# Patient Record
Sex: Female | Born: 1990 | Race: White | Hispanic: No | Marital: Single | Smoking: Never smoker
Health system: Southern US, Community
[De-identification: ages and names within clinical notes are randomized; demographics above are authoritative.]

## PROBLEM LIST (undated history)

## (undated) DIAGNOSIS — F988 Other specified behavioral and emotional disorders with onset usually occurring in childhood and adolescence: Secondary | ICD-10-CM

## (undated) DIAGNOSIS — T7840XA Allergy, unspecified, initial encounter: Secondary | ICD-10-CM

## (undated) DIAGNOSIS — F32A Depression, unspecified: Secondary | ICD-10-CM

## (undated) DIAGNOSIS — F329 Major depressive disorder, single episode, unspecified: Secondary | ICD-10-CM

## (undated) DIAGNOSIS — K219 Gastro-esophageal reflux disease without esophagitis: Secondary | ICD-10-CM

## (undated) DIAGNOSIS — I10 Essential (primary) hypertension: Secondary | ICD-10-CM

## (undated) HISTORY — DX: Essential (primary) hypertension: I10

## (undated) HISTORY — DX: Gastro-esophageal reflux disease without esophagitis: K21.9

## (undated) HISTORY — DX: Major depressive disorder, single episode, unspecified: F32.9

## (undated) HISTORY — DX: Other specified behavioral and emotional disorders with onset usually occurring in childhood and adolescence: F98.8

## (undated) HISTORY — DX: Depression, unspecified: F32.A

## (undated) HISTORY — PX: NO PAST SURGERIES: SHX2092

## (undated) HISTORY — DX: Allergy, unspecified, initial encounter: T78.40XA

---

## 2012-12-22 ENCOUNTER — Ambulatory Visit: Payer: Self-pay | Admitting: Unknown Physician Specialty

## 2012-12-22 ENCOUNTER — Emergency Department: Payer: Self-pay | Admitting: Emergency Medicine
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
## 2015-07-24 ENCOUNTER — Ambulatory Visit (INDEPENDENT_AMBULATORY_CARE_PROVIDER_SITE_OTHER): Payer: BLUE CROSS/BLUE SHIELD | Admitting: Obstetrics and Gynecology

## 2015-07-24 ENCOUNTER — Encounter: Payer: Self-pay | Admitting: Obstetrics and Gynecology

## 2015-07-24 VITALS — BP 125/84 | HR 94 | Ht 65.0 in | Wt 154.8 lb

## 2015-07-24 DIAGNOSIS — F419 Anxiety disorder, unspecified: Secondary | ICD-10-CM | POA: Diagnosis not present

## 2015-07-24 DIAGNOSIS — Z01419 Encounter for gynecological examination (general) (routine) without abnormal findings: Secondary | ICD-10-CM | POA: Diagnosis not present

## 2015-07-24 DIAGNOSIS — I10 Essential (primary) hypertension: Secondary | ICD-10-CM | POA: Diagnosis not present

## 2015-07-24 NOTE — Patient Instructions (Signed)
1.  Pap smear with  Chlamydia testing is performed today. 2.  Return in 2 weeks for Mirena IUD 3.  Abstain from intercourse for 2 weeks prior to Mirena IUD insertion. 4.  Take 800 mg ibuprofen (Advil) before coming in for IUD insertion. 5.  Annual exam-1 year

## 2015-07-24 NOTE — Progress Notes (Signed)
Patient ID: Stacy [name], female   DOB: Mar 14, 1991, 25 y.o.   MRN: [id number]  ANNUAL PREVENTATIVE CARE GYN  ENCOUNTER NOTE  Subjective:       Stacy [name] is a 25 y.o. G0P0000 female here for a routine annual gynecologic exam.  Current complaints: 1.  Wants to discuss iud  2.  History of hypertension, controlled on medication   Gynecologic History Patient's last menstrual period was 07/03/2015 (approximate). Contraception: condoms Last Pap: 2013. Results were: normal Last mammogram: n/a. Results were: n/a  Obstetric History OB History  Gravida Para Term Preterm AB SAB TAB Ectopic Multiple Living  0 0 0 0 0 0 0 0 0 0         Past Medical History  Diagnosis Date  . ADD (attention deficit disorder)   . Hypertension   . Allergy     Past Surgical History  Procedure Laterality Date  . No past surgeries      Current Outpatient Prescriptions on File Prior to Visit  Medication Sig Dispense Refill  . atomoxetine (STRATTERA) 100 MG capsule Take 100 mg by mouth daily.     No current facility-administered medications on file prior to visit.    No Known Allergies  Social History   Social History  . Marital Status: Single    Spouse Name: N/A  . Number of Children: N/A  . Years of Education: N/A   Occupational History  . Not on file.   Social History Main Topics  . Smoking status: Never Smoker   . Smokeless tobacco: Not on file  . Alcohol Use: Yes     Comment: occas  . Drug Use: No  . Sexual Activity: Yes    Birth Control/ Protection: Condom   Other Topics Concern  . Not on file   Social History Narrative    Family History  Problem Relation Age of Onset  . Heart disease Mother   . Heart disease Father   . Cancer Neg Hx   . Diabetes Neg Hx     The following portions of the patient's history were reviewed and updated as appropriate: allergies, current medications, past family history, past medical history, past social history, past surgical  history and problem list.  Review of Systems ROS Review of Systems - General ROS: negative for - chills, fatigue, fever, hot flashes, night sweats, weight gain or weight loss Psychological ROS: negative for - anxiety, decreased libido, depression, mood swings, physical abuse or sexual abuse Ophthalmic ROS: negative for - blurry vision, eye pain or loss of vision ENT ROS: negative for - headaches, hearing change, visual changes or vocal changes Allergy and Immunology ROS: negative for - hives, itchy/watery eyes or seasonal allergies Hematological and Lymphatic ROS: negative for - bleeding problems, bruising, swollen lymph nodes or weight loss Endocrine ROS: negative for - galactorrhea, hair pattern changes, hot flashes, malaise/lethargy, mood swings, palpitations, polydipsia/polyuria, skin changes, temperature intolerance or unexpected weight changes Breast ROS: negative for - new or changing breast lumps or nipple discharge Respiratory ROS: negative for - cough or shortness of breath Cardiovascular ROS: negative for - chest pain, irregular heartbeat, palpitations or shortness of breath Gastrointestinal ROS: no abdominal pain, change in bowel habits, or black or bloody stools Genito-Urinary ROS: no dysuria, trouble voiding, or hematuria Musculoskeletal ROS: negative for - joint pain or joint stiffness Neurological ROS: negative for - bowel and bladder control changes Dermatological ROS: negative for rash and skin lesion changes   Objective:   BP 125/84 mmHg  Pulse 94  Ht 5\' 5"  (1.651 m)  Wt 154 lb 12.8 oz (70.217 kg)  BMI 25.76 kg/m2  LMP 07/03/2015 (Approximate) CONSTITUTIONAL: Well-developed, well-nourished female in no acute distress.  PSYCHIATRIC: Normal mood and affect. Normal behavior. Normal judgment and thought content. NEUROLGIC: Alert and oriented to person, place, and time. Normal muscle tone coordination. No cranial nerve deficit noted. HENT:  Normocephalic, atraumatic,  External right and left ear normal. Oropharynx is clear and moist EYES: Conjunctivae and EOM are normal. Pupils are equal, round, and reactive to light. No scleral icterus.  NECK: Normal range of motion, supple, no masses.  Normal thyroid.  SKIN: Skin is warm and dry. No rash noted. Not diaphoretic. No erythema. No pallor. CARDIOVASCULAR: Normal heart rate noted, regular rhythm, no murmur. RESPIRATORY: Clear to auscultation bilaterally. Effort and breath sounds normal, no problems with respiration noted. BREASTS: Symmetric in size. No masses, skin changes, nipple drainage, or lymphadenopathy. ABDOMEN: Soft, normal bowel sounds, no distention noted.  No tenderness, rebound or guarding.  BLADDER: Normal PELVIC:  External Genitalia: Normal  BUS: Normal  Vagina: Normal  Cervix: Normal  Uterus: Normal; Anteverted, mobile, normal size and shape, nontender  Adnexa: Normal  RV: External Exam NormaI  MUSCULOSKELETAL: Normal range of motion. No tenderness.  No cyanosis, clubbing, or edema.  2+ distal pulses. LYMPHATIC: No Axillary, Supraclavicular, or Inguinal Adenopathy.    Assessment:   Annual gynecologic examination 25 y.o. Contraception: condoms; Desires IUD bmi-25 Chronic hypertension, stable on medication  Plan:  Pap: Pap, Reflex if ASCUS Mammogram: Not Indicated Stool Guaiac Testing:  Not Indicated Labs: thru pcp- Dr Arlana Pouchtate Routine preventative health maintenance measures emphasized: Exercise/Diet/Weight control, Tobacco Warnings, Alcohol/Substance use risks, Stress Management and Safe Sex Return for Mirena IUD insertion;; abstain from intercourse for 2 weeks prior to insertion; take 800 mg of ibuprofen prior to visit Return to Clinic - 1 Year   SunGardCrystal Miller, CMA  Herold HarmsMartin A Shanyce Daris, MD  Note: This dictation was prepared with Dragon dictation along with smaller phrase technology. Any transcriptional errors that result from this process are unintentional.

## 2015-07-29 LAB — PAP IG, CT-NG, RFX HPV ASCU: PAP Smear Comment: 0

## 2015-08-07 ENCOUNTER — Encounter: Payer: Self-pay | Admitting: Obstetrics and Gynecology

## 2015-08-07 ENCOUNTER — Ambulatory Visit (INDEPENDENT_AMBULATORY_CARE_PROVIDER_SITE_OTHER): Payer: BLUE CROSS/BLUE SHIELD | Admitting: Obstetrics and Gynecology

## 2015-08-07 VITALS — BP 114/74 | HR 92 | Ht 65.0 in | Wt 156.4 lb

## 2015-08-07 DIAGNOSIS — Z975 Presence of (intrauterine) contraceptive device: Secondary | ICD-10-CM

## 2015-08-07 LAB — POCT URINE PREGNANCY: PREG TEST UR: NEGATIVE

## 2015-08-07 NOTE — Patient Instructions (Signed)
1.  Return in 4 weeks for IUD string check. 2.  Advil 600-800 mg 3 times a day for cramps

## 2015-08-07 NOTE — Progress Notes (Signed)
IUD Insertion Procedure Note  Pre-operative Diagnosis: Contraception  Post-operative Diagnosis: Same  Indications: contraception  Procedure Details  Urine pregnancy test was done  and result was Negative.  The risks (including infection, bleeding, pain, and uterine perforation) and benefits of the procedure were explained to the patient and Verbal informed consent was obtained.    Cervix cleansed with Betadine. Uterus sounded to 7 cm. IUD inserted without difficulty. String visible and trimmed-3.5 cm. Patient tolerated procedure well.  IUD Information: Mirena, Lot # [id number], Expiration date 5/19.  Condition: Stable  Complications: None  Plan:  The patient was advised to call for any fever or for prolonged or severe pain or bleeding. She was advised to use OTC ibuprofen as needed for mild to moderate pain.   Attending Physician Documentation: Herold Harms, MD  Note: This dictation was prepared with Dragon dictation along with smaller phrase technology. Any transcriptional errors that result from this process are unintentional.

## 2015-08-19 ENCOUNTER — Telehealth: Payer: Self-pay | Admitting: Obstetrics and Gynecology

## 2015-08-19 NOTE — Telephone Encounter (Signed)
PT CALLED AND SHE HAD AN IUD PLACED ON THE 19TH AND SHE IS HAVING SOME ISSUE AND WANTED TO KNOW IF ITS NORMAL, SHE STARTED HER PERIOD 4 DAYS AFTER IT BEING PLACED AND IT WAS VERY PAINFUL, AND SHE HAS BEEN BLEEDING FOR OVER A WEEK, AND EVERY DAY SHE IS HAVING PAIN, AND SHE NEVER HAD THESE ISSUE BEFORE HAVING THE IUD PLACED.

## 2015-08-19 NOTE — Telephone Encounter (Signed)
Pt states she has had bleeding on/off for 7 days. She has an uncomfortable feeling when she is bleeding. Takes aleve. Advised to take ibup  q 8h. Advised it is normal to have irregular bleeding for about 6 months on mirena. Encouraged to keep 4 week f/u. If she needs to she may call and move appt up.

## 2015-08-26 ENCOUNTER — Ambulatory Visit (INDEPENDENT_AMBULATORY_CARE_PROVIDER_SITE_OTHER): Payer: BLUE CROSS/BLUE SHIELD | Admitting: Obstetrics and Gynecology

## 2015-08-26 ENCOUNTER — Other Ambulatory Visit: Payer: Self-pay | Admitting: Obstetrics and Gynecology

## 2015-08-26 ENCOUNTER — Encounter: Payer: Self-pay | Admitting: Obstetrics and Gynecology

## 2015-08-26 ENCOUNTER — Ambulatory Visit (INDEPENDENT_AMBULATORY_CARE_PROVIDER_SITE_OTHER): Payer: BLUE CROSS/BLUE SHIELD

## 2015-08-26 VITALS — BP 116/80 | HR 82 | Ht 65.0 in | Wt 154.9 lb

## 2015-08-26 DIAGNOSIS — N921 Excessive and frequent menstruation with irregular cycle: Secondary | ICD-10-CM

## 2015-08-26 DIAGNOSIS — N920 Excessive and frequent menstruation with regular cycle: Secondary | ICD-10-CM

## 2015-08-26 DIAGNOSIS — Z975 Presence of (intrauterine) contraceptive device: Principal | ICD-10-CM

## 2015-08-26 DIAGNOSIS — R102 Pelvic and perineal pain: Secondary | ICD-10-CM

## 2015-08-26 NOTE — Progress Notes (Signed)
Chief complaint: 1.  Abnormal uterine bleeding, status post IUD insertion. 2.  Pelvic pain  Patient is now 3 weeks status post Mirena IUD insertion.  Since insertion she has been having daily bleeding, occasionally at times consistent with heavy bleeding and clots, with Alidase as spreading spotting only.  Patient has not felt well since the IUD insertion with pelvic discomfort.  The pelvic discomfort is difficult to characterize.  She has had intercourse since insertion and did not experience any pain with intercourse. Patient denies anemia symptoms at this time.  She just does not feel well.  Past medical history, Past surgical history, problems, medications, and allergies are reviewed.  Review of systems: Per HPI.  OBJECTIVE: BP 116/80 mmHg  Pulse 82  Ht  (1.651 m)  Wt 154 lb 14.4 oz (70.262 kg)  BMI 25.78 kg/m2  LMP  (LMP Unknown) Pleasant white female, slightly anxious, in no acute distress. Back: Without CVA tenderness. Abdomen: Soft, nontender, without organomegaly; no peritoneal signs.  Pelvic exam: External genitalia-normal BUS-normal Vagina-animal dark blood in vault without bright red bleeding. Cervix-IUD string 3 cm; no lesions; no cervical motion tenderness Uterus-midplane, mobile, normal size, shape, NON-TENDER; IUD is not palpable in lower uterine segment or at os Adnexa-nonpalpable and nontender. Rectovaginal-normal.  External exam  ASSESSMENT: 1.  3 weeks status post Mirena IUD insertion. 2.  Chronic abnormal uterine bleeding. 3.  Pelvic pain and dysphoria since IUD insertion. 4.  Clinically appears to be appropriately placed.  On exam.  PLAN: 1.  CBC. 2.  Pelvic ultrasound to confirm IUD location. 3.  Monitor symptoms and return in 4 weeks for follow-up  A total of 15 minutes were spent face-to-face with the patient during this encounter and over half of that time dealt with counseling and coordination of care.  Herold Harms, MD  Note: This  dictation was prepared with Dragon dictation along with smaller phrase technology. Any transcriptional errors that result from this process are unintentional.

## 2015-08-26 NOTE — Patient Instructions (Signed)
1.  CBC today. 2.  Ultrasound to assess for IUD location. 3.  Monitor.  Abnormal uterine bleeding and pelvic pain symptoms and follow up in 4 weeks

## 2015-08-27 LAB — CBC WITH DIFFERENTIAL/PLATELET
BASOS ABS: 0 10*3/uL (ref 0.0–0.2)
Basos: 1 %
EOS (ABSOLUTE): 0.2 10*3/uL (ref 0.0–0.4)
Eos: 3 %
Hematocrit: 37.2 % (ref 34.0–46.6)
Hemoglobin: 12.2 g/dL (ref 11.1–15.9)
IMMATURE GRANS (ABS): 0 10*3/uL (ref 0.0–0.1)
Immature Granulocytes: 0 %
LYMPHS: 24 %
Lymphocytes Absolute: 1.5 10*3/uL (ref 0.7–3.1)
MCH: 28.2 pg (ref 26.6–33.0)
MCHC: 32.8 g/dL (ref 31.5–35.7)
MCV: 86 fL (ref 79–97)
MONOS ABS: 0.4 10*3/uL (ref 0.1–0.9)
Monocytes: 6 %
NEUTROS ABS: 4.1 10*3/uL (ref 1.4–7.0)
Neutrophils: 66 %
PLATELETS: 231 10*3/uL (ref 150–379)
RBC: 4.33 x10E6/uL (ref 3.77–5.28)
RDW: 12.8 % (ref 12.3–15.4)
WBC: 6.2 10*3/uL (ref 3.4–10.8)

## 2015-09-04 ENCOUNTER — Ambulatory Visit: Payer: BLUE CROSS/BLUE SHIELD | Admitting: Obstetrics and Gynecology

## 2015-09-12 ENCOUNTER — Telehealth: Payer: Self-pay | Admitting: Obstetrics and Gynecology

## 2015-09-12 NOTE — Telephone Encounter (Signed)
Pt is 6 week s/p mirena insertion. See 2 weeks ago for aub and pelvic pain. Exam wnl. U/s for iud placement normal. Pt states her pain and bleeding is better. Now she has no desire to have IC. IC is not painful. Advised this may work it self out after 3 months of having mirena in. Has an appt with mad on 09/23/15. Will discus with him.

## 2015-09-12 NOTE — Telephone Encounter (Signed)
Stacy [name] has a question about her IUD. She wouldn't tell me what it was. sorry

## 2015-09-23 ENCOUNTER — Ambulatory Visit (INDEPENDENT_AMBULATORY_CARE_PROVIDER_SITE_OTHER): Payer: BLUE CROSS/BLUE SHIELD | Admitting: Obstetrics and Gynecology

## 2015-09-23 ENCOUNTER — Encounter: Payer: Self-pay | Admitting: Obstetrics and Gynecology

## 2015-09-23 VITALS — BP 126/84 | HR 76 | Ht 65.0 in | Wt 154.4 lb

## 2015-09-23 DIAGNOSIS — Z975 Presence of (intrauterine) contraceptive device: Secondary | ICD-10-CM

## 2015-09-23 DIAGNOSIS — R6882 Decreased libido: Secondary | ICD-10-CM | POA: Insufficient documentation

## 2015-09-23 NOTE — Progress Notes (Signed)
Chief complaint: 1. IUD string check 2. Decreased libido  Patient presents for above issues. Ever since her IUD insertion is ago, she has noted a dramatic decrease in sexual function. Sex drive, lubrication ability, orgasmic ability, and satisfaction with intercourse has diminished dramatically. She does not report any other new health issues. No chronic alcohol, drug, tobacco use. No new partner. No new diagnoses with new medications. No chronic medications. Abnormal uterine bleeding resolved several weeks ago. Patient is not experiencing any pelvic pain.  Past medical history, past surgical history, problem list, medications, and allergies are reviewed  OBJECTIVE: BP 126/84 mmHg  Pulse 76  Ht 5\' 5"  (1.651 m)  Wt 154 lb 6.4 oz (70.035 kg)  BMI 25.69 kg/m2  LMP  (LMP Unknown) Pleasant tearful female in no acute distress. Slightly anxious. Abdomen: Soft, nontender Pelvic exam: Extremities normal BUS-normal Vagina-normal Cervix-IUD string 3 cm; no cervical motion tenderness Uterus-midplane, normal size and shape, nontender Adnexa-nonpalpable nontender Rectovaginal exam-normal external exam  ASSESSMENT: 1. Normal IUD string check 2. Decreased libido, possibly related to progestin contraceptive  PLAN: 1. Monitor symptomatology over the next 6 weeks 2. Return in 6 weeks for recheck 3. Possible options of management have been reviewed.  A total of 15 minutes were spent face-to-face with the patient during this encounter and over half of that time dealt with counseling and coordination of care.  Herold HarmsMartin A Samarion Ehle, MD  Note: This dictation was prepared with Dragon dictation along with smaller phrase technology. Any transcriptional errors that result from this process are unintentional.

## 2015-09-23 NOTE — Patient Instructions (Signed)
1. Return in 6 weeks for follow-up on IUD and libido

## 2015-10-02 ENCOUNTER — Encounter: Payer: Self-pay | Admitting: Obstetrics and Gynecology

## 2015-10-02 ENCOUNTER — Ambulatory Visit: Payer: BLUE CROSS/BLUE SHIELD | Admitting: Obstetrics and Gynecology

## 2015-10-02 ENCOUNTER — Ambulatory Visit (INDEPENDENT_AMBULATORY_CARE_PROVIDER_SITE_OTHER): Payer: BLUE CROSS/BLUE SHIELD | Admitting: Obstetrics and Gynecology

## 2015-10-02 VITALS — BP 123/86 | HR 96 | Ht 65.0 in | Wt 154.6 lb

## 2015-10-02 DIAGNOSIS — N921 Excessive and frequent menstruation with irregular cycle: Secondary | ICD-10-CM

## 2015-10-02 DIAGNOSIS — R102 Pelvic and perineal pain: Secondary | ICD-10-CM

## 2015-10-02 DIAGNOSIS — R6882 Decreased libido: Secondary | ICD-10-CM | POA: Diagnosis not present

## 2015-10-02 DIAGNOSIS — Z975 Presence of (intrauterine) contraceptive device: Secondary | ICD-10-CM | POA: Diagnosis not present

## 2015-10-02 DIAGNOSIS — L659 Nonscarring hair loss, unspecified: Secondary | ICD-10-CM

## 2015-10-02 NOTE — Progress Notes (Signed)
ROS  GYN ENCOUNTER NOTE  Subjective:       Stacy HandlerCaroline A [name] is a 25 y.o. G0P0000 female is here for gynecologic evaluation of the following issues:  1. IUD Removal   25 y/o F status post IUD insertion on 08/07/2015. Since insertion patient has been experiencing many problems. Endorses hair loss x 1-1.5 weeks. Endorses a dramatic decrease in sexual function including libido and orgasmic ability. Denies alcohol, tobacco, or drug use. Denies pain. Pt wishes to IUD removed. Contraception options reviewed, plans to use condoms for contraception upon removal.    Gynecologic History No LMP recorded. Patient is not currently having periods (Reason: IUD).   Obstetric History OB History  Gravida Para Term Preterm AB SAB TAB Ectopic Multiple Living  0 0 0 0 0 0 0 0 0 0         Past Medical History  Diagnosis Date  . ADD (attention deficit disorder)   . Hypertension   . Allergy     Past Surgical History  Procedure Laterality Date  . No past surgeries      Current Outpatient Prescriptions on File Prior to Visit  Medication Sig Dispense Refill  . atomoxetine (STRATTERA) 100 MG capsule Take 100 mg by mouth daily.    . carvedilol (COREG) 12.5 MG tablet Take 12.5 mg by mouth 2 (two) times daily with a meal.    . cetirizine (ZYRTEC) 10 MG tablet Take 10 mg by mouth daily.    Marland Kitchen. levonorgestrel (MIRENA) 20 MCG/24HR IUD 1 each by Intrauterine route once.     No current facility-administered medications on file prior to visit.    No Known Allergies  Social History   Social History  . Marital Status: Single    Spouse Name: N/A  . Number of Children: N/A  . Years of Education: N/A   Occupational History  . Not on file.   Social History Main Topics  . Smoking status: Never Smoker   . Smokeless tobacco: Not on file  . Alcohol Use: Yes     Comment: occas  . Drug Use: No  . Sexual Activity: Yes    Birth Control/ Protection: Condom, IUD   Other Topics Concern  . Not on file    Social History Narrative    Family History  Problem Relation Age of Onset  . Heart disease Mother   . Heart disease Father   . Cancer Neg Hx   . Diabetes Neg Hx     The following portions of the patient's history were reviewed and updated as appropriate: allergies, current medications, past family history, past medical history, past social history, past surgical history and problem list.  Review of Systems Review of Systems - Psychological ROS: positive for - decreased libido Endocrine ROS: positive for - hair pattern changes Review of Systems - General ROS: negative for - chills, fatigue, fever, hot flashes, malaise or night sweats Hematological and Lymphatic ROS: negative for - bleeding problems or swollen lymph nodes Gastrointestinal ROS: negative for - abdominal pain, blood in stools, change in bowel habits and nausea/vomiting Musculoskeletal ROS: negative for - joint pain, muscle pain or muscular weakness Genito-Urinary ROS: negative for - change in menstrual cycle, dysmenorrhea, dyspareunia, dysuria, genital discharge, genital ulcers, hematuria, incontinence, irregular/heavy menses, nocturia or pelvic pain  Objective:   BP 123/86 mmHg  Pulse 96  Ht 5\' 5"  (1.651 m)  Wt 154 lb 9.6 oz (70.126 kg)  BMI 25.73 kg/m2 CONSTITUTIONAL: Well-developed, well-nourished female in no acute distress.  HENT:  Normocephalic, atraumatic.  NEUROLGIC: Alert and oriented to person, place, and time. PSYCHIATRIC: Normal mood and affect. Normal behavior. Normal judgment and thought content. CARDIOVASCULAR:Not Examined RESPIRATORY: Not Examined BREASTS: Not Examined ABDOMEN: Not Examined PELVIC:   External Genitalia: Normal  BUS: Normal  Vagina: Normal  Cervix: Normal; IUD string 2 cm  Uterus: Normal size, shape,consistency, mobile  Adnexa: Normal  RV: Normal   Bladder: Nontender  PROCEDURE: IUD removal IUD string is grasped with [place] forceps and the IUD is removed without  difficulty; no bleeding encountered. Procedure well-tolerated.   Assessment:   1. IUD Removal  2. Decreased Libido  3. Hair Loss   Plan:  1. IUD Removed 2. Contraception options reviewed; pt will use Condoms 3. Take Advil or Aleeve for cramping  Return to Clinic PRN  A total of 15 minutes were spent face-to-face with the patient during this encounter and over half of that time dealt with counseling and coordination of care.  Avie Arenas, Student-PA  Herold Harms, MD   I have seen, interviewed, and examined the patient in conjunction with the [facility].A. student and affirm the diagnosis and management plan. Mireya Meditz A. Joon Pohle, MD, FACOG   Note: This dictation was prepared with Dragon dictation along with smaller phrase technology. Any transcriptional errors that result from this process are unintentional.  incomplete

## 2015-10-02 NOTE — Patient Instructions (Signed)
1. IUD removed 2. Motrin or Aleve as needed for cramps 3. Condoms for contraception. 4. Return as needed if other contraceptive choice is desired.

## 2015-11-04 ENCOUNTER — Ambulatory Visit: Payer: BLUE CROSS/BLUE SHIELD | Admitting: Obstetrics and Gynecology

## 2016-12-14 ENCOUNTER — Other Ambulatory Visit: Payer: Self-pay | Admitting: Student

## 2016-12-14 DIAGNOSIS — K219 Gastro-esophageal reflux disease without esophagitis: Secondary | ICD-10-CM

## 2016-12-20 ENCOUNTER — Ambulatory Visit
Admission: RE | Admit: 2016-12-20 | Discharge: 2016-12-20 | Disposition: A | Discharge status: Home / Self Care | Payer: BLUE CROSS/BLUE SHIELD | Source: Ambulatory Visit | Attending: Student | Admitting: Student

## 2016-12-20 DIAGNOSIS — K219 Gastro-esophageal reflux disease without esophagitis: Secondary | ICD-10-CM | POA: Insufficient documentation

## 2017-06-28 ENCOUNTER — Encounter: Payer: BLUE CROSS/BLUE SHIELD | Admitting: Obstetrics and Gynecology

## 2017-06-28 NOTE — Progress Notes (Deleted)
Patient ID: Stacy [name], female   DOB: [birth year], 26 y.o.   MRN: [id number]  ANNUAL PREVENTATIVE CARE GYN  ENCOUNTER NOTE  Subjective:       Stacy [name] is a 26 y.o. G0P0000 female here for a routine annual gynecologic exam.  Current complaints:  1.  History of hypertension, controlled on medication   Gynecologic History No LMP recorded. Patient is not currently having periods (Reason: IUD). Contraception: IUD Pap 07/2015 neg Results were: normal Last mammogram: n/a. Results were: n/a  Obstetric History OB History  Gravida Para Term Preterm AB Living  0 0 0 0 0 0  SAB TAB Ectopic Multiple Live Births  0 0 0 0          Past Medical History:  Diagnosis Date  . ADD (attention deficit disorder)   . Allergy   . Hypertension     Past Surgical History:  Procedure Laterality Date  . NO PAST SURGERIES      Current Outpatient Medications on File Prior to Visit  Medication Sig Dispense Refill  . atomoxetine (STRATTERA) 100 MG capsule Take 100 mg by mouth daily.    . carvedilol (COREG) 12.5 MG tablet Take 12.5 mg by mouth 2 (two) times daily with a meal.    . cetirizine (ZYRTEC) 10 MG tablet Take 10 mg by mouth daily.    Marland Kitchen. levonorgestrel (MIRENA) 20 MCG/24HR IUD 1 each by Intrauterine route once.     No current facility-administered medications on file prior to visit.     No Known Allergies  Social History   Socioeconomic History  . Marital status: Single    Spouse name: Not on file  . Number of children: Not on file  . Years of education: Not on file  . Highest education level: Not on file  Social Needs  . Financial resource strain: Not on file  . Food insecurity - worry: Not on file  . Food insecurity - inability: Not on file  . Transportation needs - medical: Not on file  . Transportation needs - non-medical: Not on file  Occupational History  . Not on file  Tobacco Use  . Smoking status: Never Smoker  Substance and Sexual Activity  . Alcohol  use: Yes    Comment: occas  . Drug use: No  . Sexual activity: Yes    Birth control/protection: Condom, IUD  Other Topics Concern  . Not on file  Social History Narrative  . Not on file    Family History  Problem Relation Age of Onset  . Heart disease Mother   . Heart disease Father   . Cancer Neg Hx   . Diabetes Neg Hx     The following portions of the patient's history were reviewed and updated as appropriate: allergies, current medications, past family history, past medical history, past social history, past surgical history and problem list.  Review of Systems ROS   Objective:   There were no vitals taken for this visit. CONSTITUTIONAL: Well-developed, well-nourished female in no acute distress.  PSYCHIATRIC: Normal mood and affect. Normal behavior. Normal judgment and thought content. NEUROLGIC: Alert and oriented to person, place, and time. Normal muscle tone coordination. No cranial nerve deficit noted. HENT:  Normocephalic, atraumatic, External right and left ear normal. Oropharynx is clear and moist EYES: Conjunctivae and EOM are normal. Pupils are equal, round, and reactive to light. No scleral icterus.  NECK: Normal range of motion, supple, no masses.  Normal thyroid.  SKIN: Skin is  warm and dry. No rash noted. Not diaphoretic. No erythema. No pallor. CARDIOVASCULAR: Normal heart rate noted, regular rhythm, no murmur. RESPIRATORY: Clear to auscultation bilaterally. Effort and breath sounds normal, no problems with respiration noted. BREASTS: Symmetric in size. No masses, skin changes, nipple drainage, or lymphadenopathy. ABDOMEN: Soft, normal bowel sounds, no distention noted.  No tenderness, rebound or guarding.  BLADDER: Normal PELVIC:  External Genitalia: Normal  BUS: Normal  Vagina: Normal  Cervix: Normal  Uterus: Normal; Anteverted, mobile, normal size and shape, nontender  Adnexa: Normal  RV: External Exam NormaI  MUSCULOSKELETAL: Normal range of  motion. No tenderness.  No cyanosis, clubbing, or edema.  2+ distal pulses. LYMPHATIC: No Axillary, Supraclavicular, or Inguinal Adenopathy.    Assessment:   Annual gynecologic examination 26 y.o. Contraception- iud bmi-25 Chronic hypertension, stable on medication  Plan:  Pap: Pap, Reflex if ASCUS Mammogram: Not Indicated Stool Guaiac Testing:  Not Indicated Labs: thru pcp- Dr Arlana Pouchtate Routine preventative health maintenance measures emphasized: Exercise/Diet/Weight control, Tobacco Warnings, Alcohol/Substance use risks, Stress Management and Safe Sex Return to Clinic - 1 Year   Waleska Buttery ShirleyMiller, [place]   Note: This dictation was prepared with Dragon dictation along with smaller phrase technology. Any transcriptional errors that result from this process are unintentional.

## 2017-07-25 NOTE — Progress Notes (Deleted)
Patient ID: Stacy [name], female   DOB: July 08, 1991, 27 y.o.   MRN: [id number]  ANNUAL PREVENTATIVE CARE GYN  ENCOUNTER NOTE  Subjective:       Stacy [name] is a 27 y.o. G0P0000 female here for a routine annual gynecologic exam.  Current complaints: 1.   Gynecologic History No LMP recorded. Patient is not currently having periods (Reason: IUD). Contraception: condoms Last Pap: 07/24/2015 neg/neg/neg Results were: normal Last mammogram: n/a. Results were: n/a  Obstetric History OB History  Gravida Para Term Preterm AB Living  0 0 0 0 0 0  SAB TAB Ectopic Multiple Live Births  0 0 0 0          Past Medical History:  Diagnosis Date  . ADD (attention deficit disorder)   . Allergy   . Hypertension     Past Surgical History:  Procedure Laterality Date  . NO PAST SURGERIES      Current Outpatient Medications on File Prior to Visit  Medication Sig Dispense Refill  . atomoxetine (STRATTERA) 100 MG capsule Take 100 mg by mouth daily.    . carvedilol (COREG) 12.5 MG tablet Take 12.5 mg by mouth 2 (two) times daily with a meal.    . cetirizine (ZYRTEC) 10 MG tablet Take 10 mg by mouth daily.    Marland Kitchen. levonorgestrel (MIRENA) 20 MCG/24HR IUD 1 each by Intrauterine route once.     No current facility-administered medications on file prior to visit.     No Known Allergies  Social History   Socioeconomic History  . Marital status: Single    Spouse name: Not on file  . Number of children: Not on file  . Years of education: Not on file  . Highest education level: Not on file  Social Needs  . Financial resource strain: Not on file  . Food insecurity - worry: Not on file  . Food insecurity - inability: Not on file  . Transportation needs - medical: Not on file  . Transportation needs - non-medical: Not on file  Occupational History  . Not on file  Tobacco Use  . Smoking status: Never Smoker  Substance and Sexual Activity  . Alcohol use: Yes    Comment: occas  .  Drug use: No  . Sexual activity: Yes    Birth control/protection: Condom, IUD  Other Topics Concern  . Not on file  Social History Narrative  . Not on file    Family History  Problem Relation Age of Onset  . Heart disease Mother   . Heart disease Father   . Cancer Neg Hx   . Diabetes Neg Hx     The following portions of the patient's history were reviewed and updated as appropriate: allergies, current medications, past family history, past medical history, past social history, past surgical history and problem list.  Review of Systems ROS   Objective:   There were no vitals taken for this visit. CONSTITUTIONAL: Well-developed, well-nourished female in no acute distress.  PSYCHIATRIC: Normal mood and affect. Normal behavior. Normal judgment and thought content. NEUROLGIC: Alert and oriented to person, place, and time. Normal muscle tone coordination. No cranial nerve deficit noted. HENT:  Normocephalic, atraumatic, External right and left ear normal. Oropharynx is clear and moist EYES: Conjunctivae and EOM are normal. Pupils are equal, round, and reactive to light. No scleral icterus.  NECK: Normal range of motion, supple, no masses.  Normal thyroid.  SKIN: Skin is warm and dry. No rash noted. Not  diaphoretic. No erythema. No pallor. CARDIOVASCULAR: Normal heart rate noted, regular rhythm, no murmur. RESPIRATORY: Clear to auscultation bilaterally. Effort and breath sounds normal, no problems with respiration noted. BREASTS: Symmetric in size. No masses, skin changes, nipple drainage, or lymphadenopathy. ABDOMEN: Soft, normal bowel sounds, no distention noted.  No tenderness, rebound or guarding.  BLADDER: Normal PELVIC:  External Genitalia: Normal  BUS: Normal  Vagina: Normal  Cervix: Normal  Uterus: Normal; Anteverted, mobile, normal size and shape, nontender  Adnexa: Normal  RV: External Exam NormaI  MUSCULOSKELETAL: Normal range of motion. No tenderness.  No cyanosis,  clubbing, or edema.  2+ distal pulses. LYMPHATIC: No Axillary, Supraclavicular, or Inguinal Adenopathy.    Assessment:   Annual gynecologic examination 27 y.o. Contraception: condoms; Desires IUD bmi-25 Chronic hypertension, stable on medication  Plan:  Pap: Pap, Reflex if ASCUS Mammogram: Not Indicated Stool Guaiac Testing:  Not Indicated Labs: thru pcp- Dr Arlana Pouch Routine preventative health maintenance measures emphasized: Exercise/Diet/Weight control, Tobacco Warnings, Alcohol/Substance use risks, Stress Management and Safe Sex Return to Clinic - 1 Year   Niyla Marone [place], [place]   Note: This dictation was prepared with Dragon dictation along with smaller phrase technology. Any transcriptional errors that result from this process are unintentional.

## 2017-07-27 ENCOUNTER — Encounter: Payer: BLUE CROSS/BLUE SHIELD | Admitting: Obstetrics and Gynecology

## 2017-08-05 NOTE — Progress Notes (Signed)
Patient ID: Stacy [name], female   DOB: 12/22/1990, 27 y.o.   MRN: [id number]  ANNUAL PREVENTATIVE CARE GYN  ENCOUNTER NOTE  Subjective:       Stacy [name] is a 27 y.o. G0P0000 female here for a routine annual gynecologic exam.  Current complaints: 1. Birth control options  Prior history of IUD use; disaster for patient; pelvic pain, hair loss, anxiety symptoms prompted a quick removal of the IUD. Patient is open to alternative contraceptive methods at this time.  History of essential hypertension, controlled on medication. Bowel and bladder function are normal. Patient's parents underwent a horrible divorce this past year and it has been a difficult time dealing with the fallout for her.   Gynecologic History lmp-07/13/2017 Contraception: condoms- no active at this time Last Pap: 07/24/2015 neg/neg/neg Results were: normal Last mammogram: n/a. Results were: n/a  Obstetric History OB History  Gravida Para Term Preterm AB Living  0 0 0 0 0 0  SAB TAB Ectopic Multiple Live Births  0 0 0 0          Past Medical History:  Diagnosis Date  . ADD (attention deficit disorder)   . Allergy   . Hypertension     Past Surgical History:  Procedure Laterality Date  . NO PAST SURGERIES      Current Outpatient Medications on File Prior to Visit  Medication Sig Dispense Refill  . atomoxetine (STRATTERA) 100 MG capsule Take 100 mg by mouth daily.    . carvedilol (COREG) 12.5 MG tablet Take 12.5 mg by mouth 2 (two) times daily with a meal.    . cetirizine (ZYRTEC) 10 MG tablet Take 10 mg by mouth daily.    Marland Kitchen levonorgestrel (MIRENA) 20 MCG/24HR IUD 1 each by Intrauterine route once.     No current facility-administered medications on file prior to visit.     No Known Allergies  Social History   Socioeconomic History  . Marital status: Single    Spouse name: Not on file  . Number of children: Not on file  . Years of education: Not on file  . Highest education  level: Not on file  Social Needs  . Financial resource strain: Not on file  . Food insecurity - worry: Not on file  . Food insecurity - inability: Not on file  . Transportation needs - medical: Not on file  . Transportation needs - non-medical: Not on file  Occupational History  . Not on file  Tobacco Use  . Smoking status: Never Smoker  Substance and Sexual Activity  . Alcohol use: Yes    Comment: occas  . Drug use: No  . Sexual activity: Yes    Birth control/protection: Condom, IUD  Other Topics Concern  . Not on file  Social History Narrative  . Not on file    Family History  Problem Relation Age of Onset  . Heart disease Mother   . Heart disease Father   . Cancer Neg Hx   . Diabetes Neg Hx     The following portions of the patient's history were reviewed and updated as appropriate: allergies, current medications, past family history, past medical history, past social history, past surgical history and problem list.  Review of Systems Review of Systems  Constitutional: Negative.   HENT: Negative.   Eyes: Negative.   Respiratory: Negative.   Cardiovascular: Negative.   Gastrointestinal: Negative.   Genitourinary: Negative.   Musculoskeletal: Negative.   Skin: Negative.   Neurological: Negative.  Endo/Heme/Allergies: Negative.   Psychiatric/Behavioral: Negative.      Objective:   BP 116/76   Pulse 86   Ht 5\' 5"  (1.651 m)   Wt 142 lb 8 oz (64.6 kg)   LMP 07/13/2017 (Approximate)   BMI 23.71 kg/m  CONSTITUTIONAL: Well-developed, well-nourished female in no acute distress.  PSYCHIATRIC: Normal mood and affect. Normal behavior. Normal judgment and thought content. NEUROLGIC: Alert and oriented to person, place, and time. Normal muscle tone coordination. No cranial nerve deficit noted. HENT:  Normocephalic, atraumatic, External right and left ear normal. Oropharynx is clear and moist EYES: Conjunctivae and EOM are normal. No scleral icterus.  NECK: Normal  range of motion, supple, no masses.  Normal thyroid.  SKIN: Skin is warm and dry. No rash noted. Not diaphoretic. No erythema. No pallor. CARDIOVASCULAR: Normal heart rate noted, regular rhythm, no murmur. RESPIRATORY: Clear to auscultation bilaterally. Effort and breath sounds normal, no problems with respiration noted. BREASTS: Symmetric in size. No masses, skin changes, nipple drainage, or lymphadenopathy. ABDOMEN: Soft, normal bowel sounds, no distention noted.  No tenderness, rebound or guarding.  BLADDER: Normal PELVIC:  External Genitalia: Normal  BUS: Normal  Vagina: Normal  Cervix: Normal; nulliparous; no cervical motion tenderness  Uterus: Normal; Anteverted, mobile, normal size and shape, nontender  Adnexa: Normal; nonpalpable nontender  RV: External Exam NormaI  MUSCULOSKELETAL: Normal range of motion. No tenderness.  No cyanosis, clubbing, or edema.  2+ distal pulses. LYMPHATIC: No Axillary, Supraclavicular, or Inguinal Adenopathy.    Assessment:   Annual gynecologic examination 27 y.o. Contraception: condoms and OCP (estrogen/progesterone) bmi-23 Chronic hypertension, stable on medication   Plan:  Pap: Pap, Reflex if ASCUS Mammogram: Not Indicated Stool Guaiac Testing:  Not Indicated Labs: thru pcp- Dr Arlana Pouchtate Routine preventative health maintenance measures emphasized: Exercise/Diet/Weight control, Tobacco Warnings, Alcohol/Substance use risks, Stress Management and Safe Sex  Begin Lo Loestrin oral contraceptive Return in 3 months for follow-up on birth control pills and blood pressure Return to Clinic - 1 Year   Miley Lindon SolonMiller, CMA  Herold HarmsMartin A Defrancesco, MD  Note: This dictation was prepared with Dragon dictation along with smaller phrase technology. Any transcriptional errors that result from this process are unintentional.

## 2017-08-10 ENCOUNTER — Ambulatory Visit (INDEPENDENT_AMBULATORY_CARE_PROVIDER_SITE_OTHER): Payer: BLUE CROSS/BLUE SHIELD | Admitting: Obstetrics and Gynecology

## 2017-08-10 ENCOUNTER — Encounter: Payer: Self-pay | Admitting: Obstetrics and Gynecology

## 2017-08-10 VITALS — BP 116/76 | HR 86 | Ht 65.0 in | Wt 142.5 lb

## 2017-08-10 DIAGNOSIS — I10 Essential (primary) hypertension: Secondary | ICD-10-CM | POA: Diagnosis not present

## 2017-08-10 DIAGNOSIS — Z01419 Encounter for gynecological examination (general) (routine) without abnormal findings: Secondary | ICD-10-CM

## 2017-08-10 DIAGNOSIS — Z30011 Encounter for initial prescription of contraceptive pills: Secondary | ICD-10-CM

## 2017-08-10 MED ORDER — NORETHIN-ETH ESTRAD-FE BIPHAS 1 MG-10 MCG / 10 MCG PO TABS: 1.0000 | ORAL_TABLET | Freq: Every day | ORAL | 11 refills | Status: AC

## 2017-08-10 NOTE — Addendum Note (Signed)
Addended by: Marchelle FolksMILLER, Anthonella Klausner G on: 08/10/2017 01:59 PM   Modules accepted: Orders

## 2017-08-10 NOTE — Patient Instructions (Signed)
1.  Pap smear is done 2.  Self breast awareness is encouraged 3.  Begin Lo Loestrin oral contraceptive 4.  Return in 3 months for follow-up on birth control pills and blood pressure 5.  Continue with healthy eating and exercise 6.  Safe sex practices encouraged 7.  Return in 1 year for annual exam  Health Maintenance, Female Adopting a healthy lifestyle and getting preventive care can go a long way to promote health and wellness. Talk with your health care provider about what schedule of regular examinations is right for you. This is a good chance for you to check in with your provider about disease prevention and staying healthy. In between checkups, there are plenty of things you can do on your own. Experts have done a lot of research about which lifestyle changes and preventive measures are most likely to keep you healthy. Ask your health care provider for more information. Weight and diet Eat a healthy diet  Be sure to include plenty of vegetables, fruits, low-fat dairy products, and lean protein.  Do not eat a lot of foods high in solid fats, added sugars, or salt.  Get regular exercise. This is one of the most important things you can do for your health. ? Most adults should exercise for at least 150 minutes each week. The exercise should increase your heart rate and make you sweat (moderate-intensity exercise). ? Most adults should also do strengthening exercises at least twice a week. This is in addition to the moderate-intensity exercise.  Maintain a healthy weight  Body mass index (BMI) is a measurement that can be used to identify possible weight problems. It estimates body fat based on height and weight. Your health care provider can help determine your BMI and help you achieve or maintain a healthy weight.  For females 54 years of age and older: ? A BMI below 18.5 is considered underweight. ? A BMI of 18.5 to 24.9 is normal. ? A BMI of 25 to 29.9 is considered  overweight. ? A BMI of 30 and above is considered obese.  Watch levels of cholesterol and blood lipids  You should start having your blood tested for lipids and cholesterol at 29 years of age, then have this test every 5 years.  You may need to have your cholesterol levels checked more often if: ? Your lipid or cholesterol levels are high. ? You are older than 29 years of age. ? You are at high risk for heart disease.  Cancer screening Lung Cancer  Lung cancer screening is recommended for adults 37-33 years old who are at high risk for lung cancer because of a history of smoking.  A yearly low-dose CT scan of the lungs is recommended for people who: ? Currently smoke. ? Have quit within the past 15 years. ? Have at least a 30-pack-year history of smoking. A pack year is smoking an average of one pack of cigarettes a day for 1 year.  Yearly screening should continue until it has been 15 years since you quit.  Yearly screening should stop if you develop a health problem that would prevent you from having lung cancer treatment.  Breast Cancer  Practice breast self-awareness. This means understanding how your breasts normally appear and feel.  It also means doing regular breast self-exams. Let your health care provider know about any changes, no matter how small.  If you are in your 20s or 30s, you should have a clinical breast exam (CBE) by a health  care provider every 1-3 years as part of a regular health exam.  If you are 5 or older, have a CBE every year. Also consider having a breast X-ray (mammogram) every year.  If you have a family history of breast cancer, talk to your health care provider about genetic screening.  If you are at high risk for breast cancer, talk to your health care provider about having an MRI and a mammogram every year.  Breast cancer gene (BRCA) assessment is recommended for women who have family members with BRCA-related cancers. BRCA-related cancers  include: ? Breast. ? Ovarian. ? Tubal. ? Peritoneal cancers.  Results of the assessment will determine the need for genetic counseling and BRCA1 and BRCA2 testing.  Cervical Cancer Your health care provider may recommend that you be screened regularly for cancer of the pelvic organs (ovaries, uterus, and vagina). This screening involves a pelvic examination, including checking for microscopic changes to the surface of your cervix (Pap test). You may be encouraged to have this screening done every 3 years, beginning at age 63.  For women ages 46-65, health care providers may recommend pelvic exams and Pap testing every 3 years, or they may recommend the Pap and pelvic exam, combined with testing for human papilloma virus (HPV), every 5 years. Some types of HPV increase your risk of cervical cancer. Testing for HPV may also be done on women of any age with unclear Pap test results.  Other health care providers may not recommend any screening for nonpregnant women who are considered low risk for pelvic cancer and who do not have symptoms. Ask your health care provider if a screening pelvic exam is right for you.  If you have had past treatment for cervical cancer or a condition that could lead to cancer, you need Pap tests and screening for cancer for at least 20 years after your treatment. If Pap tests have been discontinued, your risk factors (such as having a new sexual partner) need to be reassessed to determine if screening should resume. Some women have medical problems that increase the chance of getting cervical cancer. In these cases, your health care provider may recommend more frequent screening and Pap tests.  Colorectal Cancer  This type of cancer can be detected and often prevented.  Routine colorectal cancer screening usually begins at 29 years of age and continues through 29 years of age.  Your health care provider may recommend screening at an earlier age if you have risk factors  for colon cancer.  Your health care provider may also recommend using home test kits to check for hidden blood in the stool.  A small camera at the end of a tube can be used to examine your colon directly (sigmoidoscopy or colonoscopy). This is done to check for the earliest forms of colorectal cancer.  Routine screening usually begins at age 27.  Direct examination of the colon should be repeated every 5-10 years through 29 years of age. However, you may need to be screened more often if early forms of precancerous polyps or small growths are found.  Skin Cancer  Check your skin from head to toe regularly.  Tell your health care provider about any new moles or changes in moles, especially if there is a change in a mole's shape or color.  Also tell your health care provider if you have a mole that is larger than the size of a pencil eraser.  Always use sunscreen. Apply sunscreen liberally and repeatedly throughout the  day.  Protect yourself by wearing long sleeves, pants, a wide-brimmed hat, and sunglasses whenever you are outside.  Heart disease, diabetes, and high blood pressure  High blood pressure causes heart disease and increases the risk of stroke. High blood pressure is more likely to develop in: ? People who have blood pressure in the high end of the normal range (130-139/85-89 mm Hg). ? People who are overweight or obese. ? People who are African American.  If you are 33-25 years of age, have your blood pressure checked every 3-5 years. If you are 54 years of age or older, have your blood pressure checked every year. You should have your blood pressure measured twice-once when you are at a hospital or clinic, and once when you are not at a hospital or clinic. Record the average of the two measurements. To check your blood pressure when you are not at a hospital or clinic, you can use: ? An automated blood pressure machine at a pharmacy. ? A home blood pressure monitor.  If  you are between 59 years and 73 years old, ask your health care provider if you should take aspirin to prevent strokes.  Have regular diabetes screenings. This involves taking a blood sample to check your fasting blood sugar level. ? If you are at a normal weight and have a low risk for diabetes, have this test once every three years after 29 years of age. ? If you are overweight and have a high risk for diabetes, consider being tested at a younger age or more often. Preventing infection Hepatitis B  If you have a higher risk for hepatitis B, you should be screened for this virus. You are considered at high risk for hepatitis B if: ? You were born in a country where hepatitis B is common. Ask your health care provider which countries are considered high risk. ? Your parents were born in a high-risk country, and you have not been immunized against hepatitis B (hepatitis B vaccine). ? You have HIV or AIDS. ? You use needles to inject street drugs. ? You live with someone who has hepatitis B. ? You have had sex with someone who has hepatitis B. ? You get hemodialysis treatment. ? You take certain medicines for conditions, including cancer, organ transplantation, and autoimmune conditions.  Hepatitis C  Blood testing is recommended for: ? Everyone born from [birth year] through 1967. ? Anyone with known risk factors for hepatitis C.  Sexually transmitted infections (STIs)  You should be screened for sexually transmitted infections (STIs) including gonorrhea and chlamydia if: ? You are sexually active and are younger than 29 years of age. ? You are older than 29 years of age and your health care provider tells you that you are at risk for this type of infection. ? Your sexual activity has changed since you were last screened and you are at an increased risk for chlamydia or gonorrhea. Ask your health care provider if you are at risk.  If you do not have HIV, but are at risk, it may be recommended  that you take a prescription medicine daily to prevent HIV infection. This is called pre-exposure prophylaxis (PrEP). You are considered at risk if: ? You are sexually active and do not regularly use condoms or know the HIV status of your partner(s). ? You take drugs by injection. ? You are sexually active with a partner who has HIV.  Talk with your health care provider about whether you are at high  risk of being infected with HIV. If you choose to begin PrEP, you should first be tested for HIV. You should then be tested every 3 months for as long as you are taking PrEP. Pregnancy  If you are premenopausal and you may become pregnant, ask your health care provider about preconception counseling.  If you may become pregnant, take 400 to 800 micrograms (mcg) of folic acid every day.  If you want to prevent pregnancy, talk to your health care provider about birth control (contraception). Osteoporosis and menopause  Osteoporosis is a disease in which the bones lose minerals and strength with aging. This can result in serious bone fractures. Your risk for osteoporosis can be identified using a bone density scan.  If you are 61 years of age or older, or if you are at risk for osteoporosis and fractures, ask your health care provider if you should be screened.  Ask your health care provider whether you should take a calcium or vitamin D supplement to lower your risk for osteoporosis.  Menopause may have certain physical symptoms and risks.  Hormone replacement therapy may reduce some of these symptoms and risks. Talk to your health care provider about whether hormone replacement therapy is right for you. Follow these instructions at home:  Schedule regular health, dental, and eye exams.  Stay current with your immunizations.  Do not use any tobacco products including cigarettes, chewing tobacco, or electronic cigarettes.  If you are pregnant, do not drink alcohol.  If you are  breastfeeding, limit how much and how often you drink alcohol.  Limit alcohol intake to no more than 1 drink per day for nonpregnant women. One drink equals 12 ounces of beer, 5 ounces of wine, or 1 ounces of hard liquor.  Do not use street drugs.  Do not share needles.  Ask your health care provider for help if you need support or information about quitting drugs.  Tell your health care provider if you often feel depressed.  Tell your health care provider if you have ever been abused or do not feel safe at home. This information is not intended to replace advice given to you by your health care provider. Make sure you discuss any questions you have with your health care provider. Document Released: 01/18/2011 Document Revised: 12/11/2015 Document Reviewed: 04/08/2015 Elsevier Interactive Patient Education  [employer].

## 2017-08-11 LAB — PAP IG W/ RFLX HPV ASCU: PAP SMEAR COMMENT: 0

## 2017-09-02 ENCOUNTER — Ambulatory Visit
Admission: RE | Admit: 2017-09-02 | Discharge: 2017-09-02 | Disposition: A | Discharge status: Home / Self Care | Payer: BLUE CROSS/BLUE SHIELD | Source: Ambulatory Visit | Attending: Internal Medicine | Admitting: Internal Medicine

## 2017-09-02 DIAGNOSIS — R002 Palpitations: Secondary | ICD-10-CM | POA: Diagnosis not present

## 2017-11-08 ENCOUNTER — Encounter: Payer: BLUE CROSS/BLUE SHIELD | Admitting: Obstetrics and Gynecology

## 2018-11-19 IMAGING — RF DG UGI W/O KUB
10 series · 10 of 10 positions shown · non-contrast
Comparison: No prior.

CLINICAL DATA: Vomiting.  Epigastric pressure .

EXAM:
UPPER GI SERIES WITHOUT KUB
TECHNIQUE: Routine upper GI series was performed with high density and thin
barium.
FLUOROSCOPY TIME:  Fluoroscopy Time:  1 minutes 24 seconds
Radiation Exposure Index (if provided by the fluoroscopic device):
14.2 mGy
Number of Acquired Spot Images: 10

[Series 1: fluoro_barium singleshot_bw · 0.18mm/px · 1 of 1 slices shown (1 of 10)]
[im 1/1]
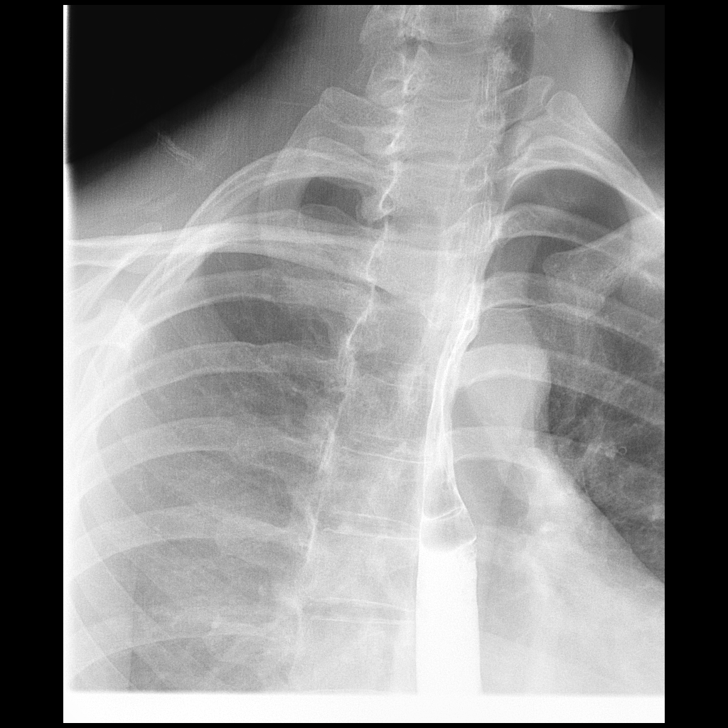

[Series 2: fluoro_barium singleshot_bw · 0.18mm/px · 1 of 1 slices shown (2 of 10)]
[im 1/1]
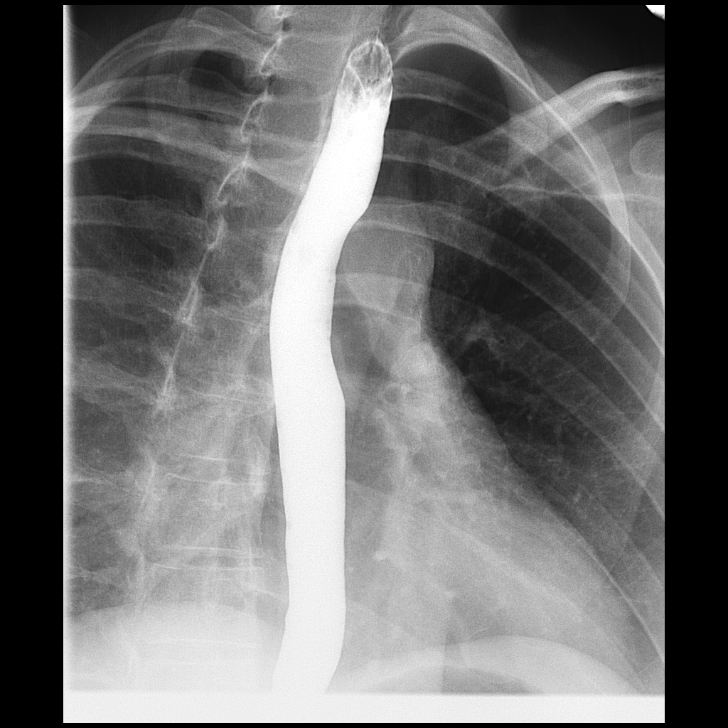

[Series 3: fluoro_barium singleshot_bw · 0.18mm/px · 1 of 1 slices shown (3 of 10)]
[im 1/1]
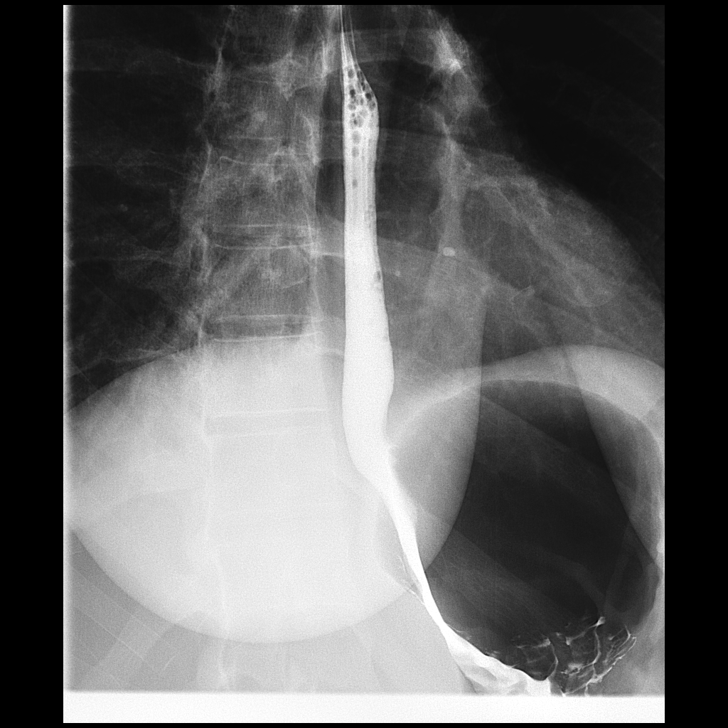

[Series 4: fluoro_barium singleshot_bw · 0.18mm/px · 1 of 1 slices shown (4 of 10)]
[im 1/1]
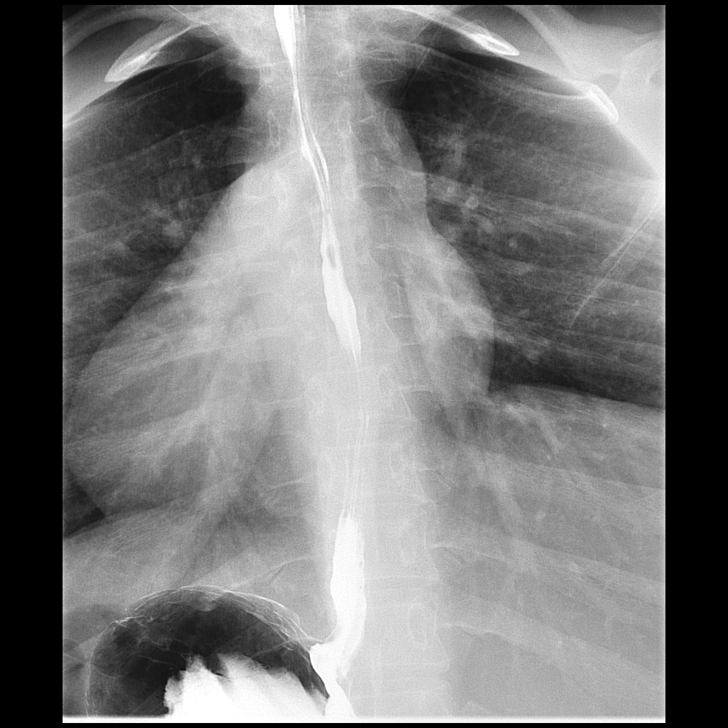

[Series 5: fluoro_barium singleshot_bw · 0.18mm/px · 1 of 1 slices shown (5 of 10)]
[im 1/1]
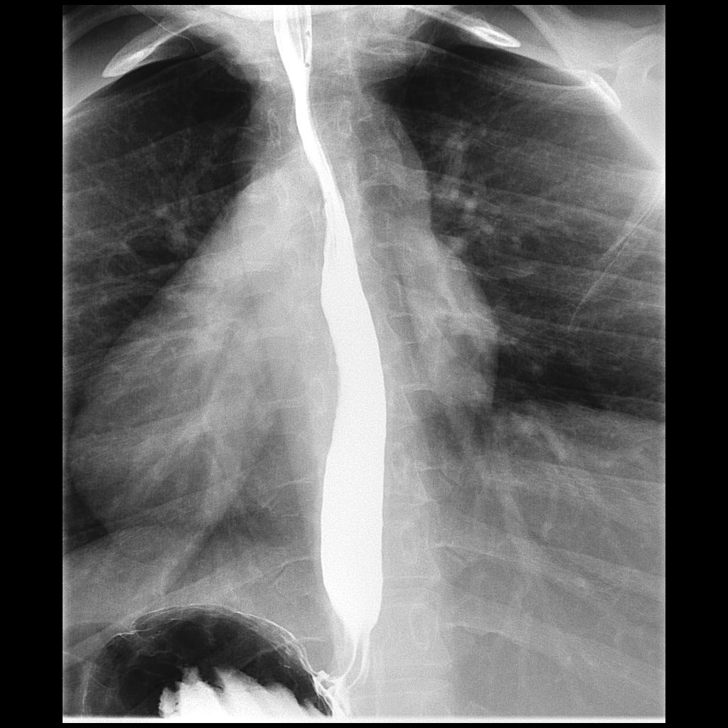

[Series 6: fluoro_barium singleshot_bw · 0.18mm/px · 1 of 1 slices shown (6 of 10)]
[im 1/1]
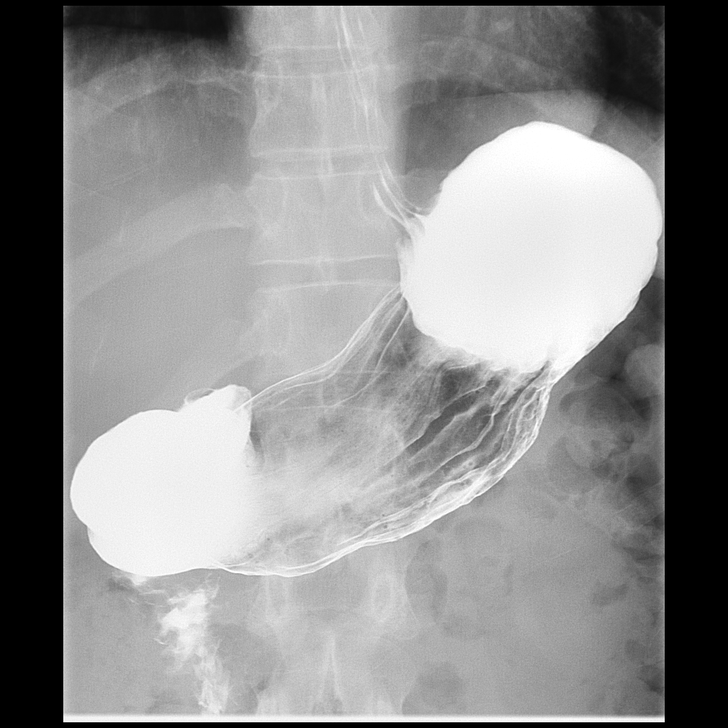

[Series 7: fluoro_barium singleshot_bw · 0.18mm/px · 1 of 1 slices shown (7 of 10)]
[im 1/1]
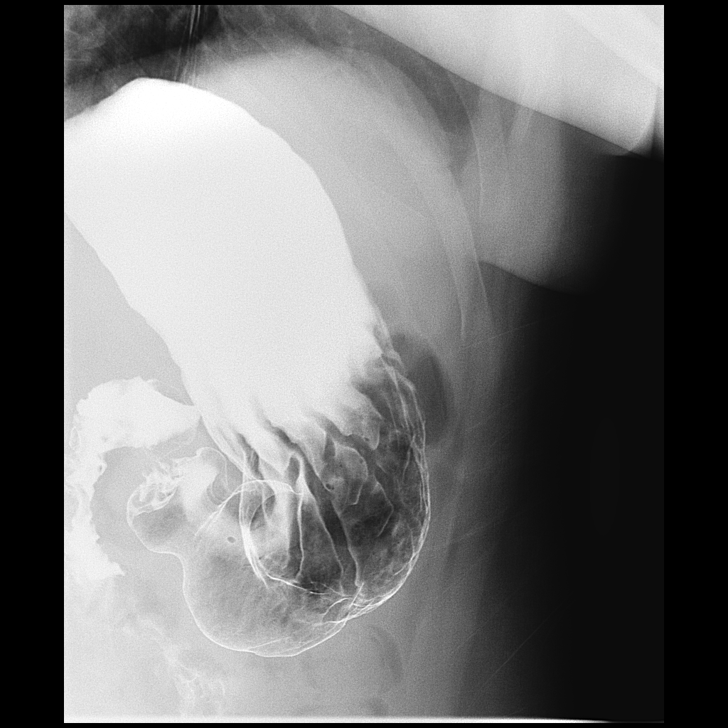

[Series 8: fluoro_barium singleshot_bw · 0.18mm/px · 1 of 1 slices shown (8 of 10)]
[im 1/1]
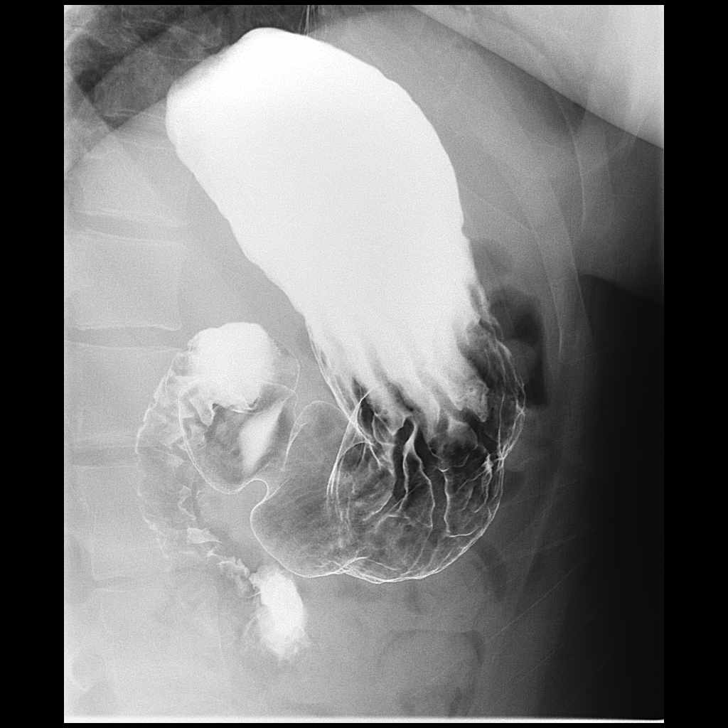

[Series 9: fluoro_barium singleshot_bw · 0.18mm/px · 1 of 1 slices shown (9 of 10)]
[im 1/1]
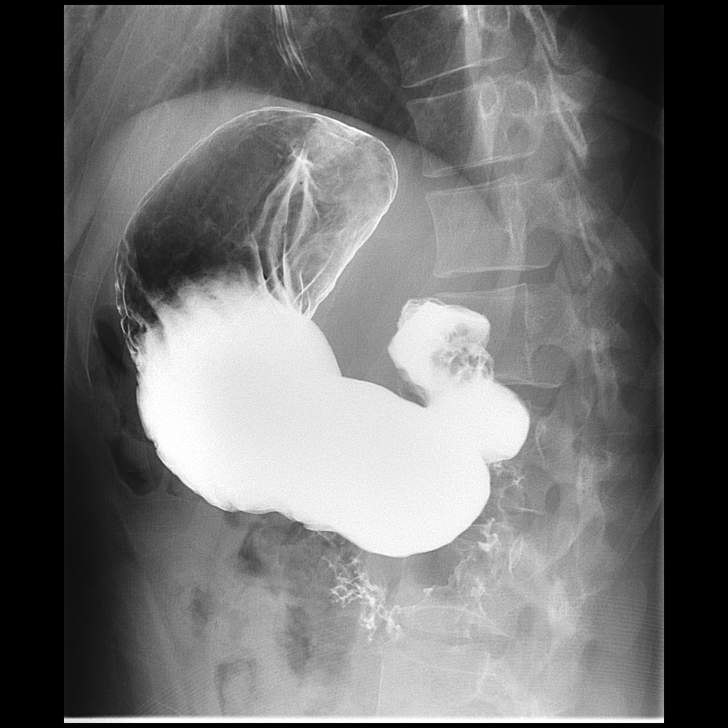

[Series 10: fluoro_barium singleshot_bw · 0.19mm/px · 1 of 1 slices shown (10 of 10)]
[im 1/1]
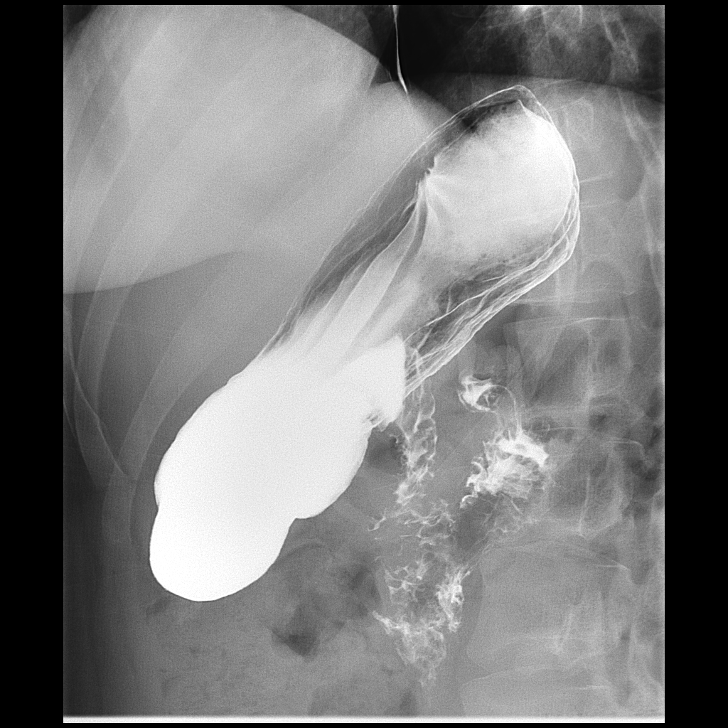

[10 of 10 positions shown; findings below may reference images not displayed]

FINDINGS: The esophagus is widely patent. Stomach is normal. Duodenum is
normal. C-loop is normal. No reflux.
IMPRESSION: Normal exam.

## 2019-10-17 ENCOUNTER — Other Ambulatory Visit: Payer: Self-pay

## 2019-10-17 ENCOUNTER — Ambulatory Visit (INDEPENDENT_AMBULATORY_CARE_PROVIDER_SITE_OTHER): Payer: Self-pay | Admitting: Dermatology

## 2019-10-17 DIAGNOSIS — L988 Other specified disorders of the skin and subcutaneous tissue: Secondary | ICD-10-CM

## 2019-10-17 NOTE — Progress Notes (Signed)
   Follow-Up Visit   Subjective  Stacy [name] is a 29 y.o. female who presents for the following: Facial Elastosis (3 months f/u Botox).   The following portions of the chart were reviewed this encounter and updated as appropriate:     Review of Systems: No other skin or systemic complaints.  Objective  Well appearing patient in no apparent distress; mood and affect are within normal limits.  A focused examination was performed including face. Relevant physical exam findings are noted in the Assessment and Plan.  Objective  Head - Anterior (Face): Rhytides and volume loss.   Images                Assessment & Plan  Elastosis of skin Head - Anterior (Face)  Botox Injection - Head - Anterior (Face) Location: See attached image  Informed consent: Discussed risks (infection, pain, bleeding, bruising, swelling, allergic reaction, paralysis of nearby muscles, eyelid droop, double vision, neck weakness, difficulty breathing, headache, undesirable cosmetic result, and need for additional treatment) and benefits of the procedure, as well as the alternatives.  Informed consent was obtained.  Preparation: The area was cleansed with alcohol.  Procedure Details:  Botox was injected into the dermis with a 30-gauge needle. Pressure applied to any bleeding. Ice packs offered for swelling.  Lot Number:  [id number] Expiration:  06/2022  Total Units Injected:  25 units injected   Plan: Patient was instructed to remain upright for 4 hours. Patient was instructed to avoid massaging the face and avoid vigorous exercise for the rest of the day. Tylenol may be used for headache.  Allow 2 weeks before returning to clinic for additional dosing as needed. Patient will call for any problems.   Return in about 3 months (around 01/16/2020), or Botox,, for Botox 25 units today.   IAngelique Holm, CMA, am acting as scribe for Armida Sans, MD .

## 2019-10-30 ENCOUNTER — Ambulatory Visit: Payer: BLUE CROSS/BLUE SHIELD | Admitting: Dermatology

## 2020-01-01 ENCOUNTER — Ambulatory Visit: Payer: 59 | Admitting: Dermatology

## 2020-02-07 ENCOUNTER — Other Ambulatory Visit: Payer: Self-pay

## 2020-02-07 ENCOUNTER — Ambulatory Visit (INDEPENDENT_AMBULATORY_CARE_PROVIDER_SITE_OTHER): Payer: 59 | Admitting: Dermatology

## 2020-02-07 DIAGNOSIS — L988 Other specified disorders of the skin and subcutaneous tissue: Secondary | ICD-10-CM

## 2020-02-07 NOTE — Progress Notes (Signed)
   Follow-Up Visit   Subjective  Stacy [name] is a 29 y.o. female who presents for the following: Facial Elastosis.  Patient here today for Botox.    The following portions of the chart were reviewed this encounter and updated as appropriate:  Tobacco  Allergies  Meds  Problems  Med Hx  Surg Hx  Fam Hx     Review of Systems:  No other skin or systemic complaints except as noted in HPI or Assessment and Plan.  Objective  Well appearing patient in no apparent distress; mood and affect are within normal limits.  A focused examination was performed including face. Relevant physical exam findings are noted in the Assessment and Plan.  Objective  Forehead, Frown Complex: Rhytides and volume loss.   Images     Assessment & Plan    Elastosis of skin Forehead, Frown Complex  Botox Injection - Forehead, Frown Complex Location: See attached image  Informed consent: Discussed risks (infection, pain, bleeding, bruising, swelling, allergic reaction, paralysis of nearby muscles, eyelid droop, double vision, neck weakness, difficulty breathing, headache, undesirable cosmetic result, and need for additional treatment) and benefits of the procedure, as well as the alternatives.  Informed consent was obtained.  Preparation: The area was cleansed with alcohol.  Procedure Details:  Botox was injected into the dermis with a 30-gauge needle. Pressure applied to any bleeding. Ice packs offered for swelling.  Lot Number:  [id number] Expiration:  05/2022  Total Units Injected:  25  Plan: Patient was instructed to remain upright for 4 hours. Patient was instructed to avoid massaging the face and avoid vigorous exercise for the rest of the day. Tylenol may be used for headache.  Allow 2 weeks before returning to clinic for additional dosing as needed. Patient will call for any problems.   Return 3-4 months, for Botox.  Anise Salvo, RMA, am acting as scribe for Armida Sans, MD . Documentation: I have reviewed the above documentation for accuracy and completeness, and I agree with the above.  Armida Sans, MD

## 2020-02-10 ENCOUNTER — Encounter: Payer: Self-pay | Admitting: Dermatology

## 2020-04-29 ENCOUNTER — Other Ambulatory Visit: Payer: Self-pay

## 2020-04-29 ENCOUNTER — Ambulatory Visit (INDEPENDENT_AMBULATORY_CARE_PROVIDER_SITE_OTHER): Payer: Self-pay | Admitting: Dermatology

## 2020-04-29 DIAGNOSIS — L988 Other specified disorders of the skin and subcutaneous tissue: Secondary | ICD-10-CM

## 2020-04-29 NOTE — Progress Notes (Signed)
   Follow-Up Visit   Subjective  Stacy [name] is a 29 y.o. female who presents for the following: Facial Elastosis (Botox today).  The following portions of the chart were reviewed this encounter and updated as appropriate:  Tobacco  Allergies  Meds  Problems  Med Hx  Surg Hx  Fam Hx     Review of Systems:  No other skin or systemic complaints except as noted in HPI or Assessment and Plan.  Objective  Well appearing patient in no apparent distress; mood and affect are within normal limits.  A focused examination was performed including face. Relevant physical exam findings are noted in the Assessment and Plan.  Objective  Face: Rhytides and volume loss.   Images     Assessment & Plan  Elastosis of skin Face  Botox Injection - Face Location: See attached image  Informed consent: Discussed risks (infection, pain, bleeding, bruising, swelling, allergic reaction, paralysis of nearby muscles, eyelid droop, double vision, neck weakness, difficulty breathing, headache, undesirable cosmetic result, and need for additional treatment) and benefits of the procedure, as well as the alternatives.  Informed consent was obtained.  Preparation: The area was cleansed with alcohol.  Procedure Details:  Botox was injected into the dermis with a 30-gauge needle. Pressure applied to any bleeding. Ice packs offered for swelling.  Lot Number:  [id number] Expiration:  07/2022  Total Units Injected:  25  Plan: Patient was instructed to remain upright for 4 hours. Patient was instructed to avoid massaging the face and avoid vigorous exercise for the rest of the day. Tylenol may be used for headache.  Allow 2 weeks before returning to clinic for additional dosing as needed. Patient will call for any problems.   Return for Botox in 3-4 months.  I, Joanie Coddington, CMA, am acting as scribe for Armida Sans, MD .  Documentation: I have reviewed the above documentation for accuracy and  completeness, and I agree with the above.  Armida Sans, MD

## 2020-04-30 ENCOUNTER — Encounter: Payer: Self-pay | Admitting: Dermatology

## 2020-05-13 ENCOUNTER — Ambulatory Visit: Payer: 59 | Admitting: Dermatology

## 2020-07-22 ENCOUNTER — Ambulatory Visit (INDEPENDENT_AMBULATORY_CARE_PROVIDER_SITE_OTHER): Payer: Self-pay | Admitting: Dermatology

## 2020-07-22 ENCOUNTER — Other Ambulatory Visit: Payer: Self-pay

## 2020-07-22 ENCOUNTER — Encounter: Payer: Self-pay | Admitting: Dermatology

## 2020-07-22 DIAGNOSIS — L988 Other specified disorders of the skin and subcutaneous tissue: Secondary | ICD-10-CM

## 2020-07-22 NOTE — Progress Notes (Signed)
   Follow-Up Visit   Subjective  Stacy [name] is a 30 y.o. female who presents for the following: Facial Elastosis (Patient is here today for Botox - she is pleased with her previous amount).  The following portions of the chart were reviewed this encounter and updated as appropriate:   Tobacco  Allergies  Meds  Problems  Med Hx  Surg Hx  Fam Hx     Review of Systems:  No other skin or systemic complaints except as noted in HPI or Assessment and Plan.  Objective  Well appearing patient in no apparent distress; mood and affect are within normal limits.  A focused examination was performed including the face. Relevant physical exam findings are noted in the Assessment and Plan.  Objective  Face: Rhytides and volume loss.   Images    Assessment & Plan  Elastosis of skin Face  Discussed increasing dose vs coming in a little sooner at 3 months since patient has movement today.   Botox injected as marked: - frown complex 20 units - forehead 5 units  Botox Injection - Face Location: See attached image  Informed consent: Discussed risks (infection, pain, bleeding, bruising, swelling, allergic reaction, paralysis of nearby muscles, eyelid droop, double vision, neck weakness, difficulty breathing, headache, undesirable cosmetic result, and need for additional treatment) and benefits of the procedure, as well as the alternatives.  Informed consent was obtained.  Preparation: The area was cleansed with alcohol.  Procedure Details:  Botox was injected into the dermis with a 30-gauge needle. Pressure applied to any bleeding. Ice packs offered for swelling.  Lot Number:  [id number] Expiration:  09/2022  Total Units Injected:  25  Plan: Patient was instructed to remain upright for 4 hours. Patient was instructed to avoid massaging the face and avoid vigorous exercise for the rest of the day. Tylenol may be used for headache.  Allow 2 weeks before returning to clinic for  additional dosing as needed. Patient will call for any problems.  Discussed potentially increasing frown complex to 27.5 u and continuing 5 u of forehead in future.  Pt may consider.  Return in about 3 months (around 10/20/2020) for cosmetic - Botox .  Maylene Roes, CMA, am acting as scribe for Armida Sans, MD .  Documentation: I have reviewed the above documentation for accuracy and completeness, and I agree with the above.  Armida Sans, MD

## 2020-08-05 ENCOUNTER — Ambulatory Visit: Payer: 59 | Admitting: Dermatology

## 2020-08-18 ENCOUNTER — Other Ambulatory Visit: Payer: Self-pay | Admitting: Dermatology

## 2020-08-18 ENCOUNTER — Telehealth: Payer: Self-pay

## 2020-08-18 NOTE — Telephone Encounter (Signed)
Pt called to requesting a refill of Ziana gel she was prescribed, pt report she see Dr Kirtland Bouchard every 3 months for Botox and during those visits he will check her acne and okay her to continue Gery Pray refilled sent to pharmacy

## 2020-09-22 ENCOUNTER — Telehealth: Payer: Self-pay

## 2020-09-22 MED ORDER — CLINDAMYCIN-TRETINOIN 1.2-0.025 % EX GEL: Freq: Every day | CUTANEOUS | 0 refills | Status: AC

## 2020-09-22 NOTE — Telephone Encounter (Signed)
Ziana RF to [facility]. Patient uses as maintenance for acne.

## 2020-09-23 ENCOUNTER — Other Ambulatory Visit: Payer: Self-pay

## 2020-09-23 ENCOUNTER — Ambulatory Visit (INDEPENDENT_AMBULATORY_CARE_PROVIDER_SITE_OTHER): Payer: Self-pay | Admitting: Dermatology

## 2020-09-23 ENCOUNTER — Encounter: Payer: Self-pay | Admitting: Dermatology

## 2020-09-23 DIAGNOSIS — L988 Other specified disorders of the skin and subcutaneous tissue: Secondary | ICD-10-CM

## 2020-09-23 DIAGNOSIS — L700 Acne vulgaris: Secondary | ICD-10-CM

## 2020-09-23 NOTE — Progress Notes (Signed)
   Follow-Up Visit   Subjective  Stacy [name] is a 30 y.o. female who presents for the following: Facial Elastosis (Face, pt presents for Botox, last txt 07/22/20).  The following portions of the chart were reviewed this encounter and updated as appropriate:   Tobacco  Allergies  Meds  Problems  Med Hx  Surg Hx  Fam Hx     Review of Systems:  No other skin or systemic complaints except as noted in HPI or Assessment and Plan.  Objective  Well appearing patient in no apparent distress; mood and affect are within normal limits.  A focused examination was performed including face. Relevant physical exam findings are noted in the Assessment and Plan.  Objective  face: Rhytides and volume loss.   Images     Assessment & Plan  Elastosis of skin face  Botox 25 units injected today to: - Frown complex 20 units - Forehead 5 units  Botox Injection - face Location: Frown complex, forehead  Informed consent: Discussed risks (infection, pain, bleeding, bruising, swelling, allergic reaction, paralysis of nearby muscles, eyelid droop, double vision, neck weakness, difficulty breathing, headache, undesirable cosmetic result, and need for additional treatment) and benefits of the procedure, as well as the alternatives.  Informed consent was obtained.  Preparation: The area was cleansed with alcohol.  Procedure Details:  Botox was injected into the dermis with a 30-gauge needle. Pressure applied to any bleeding. Ice packs offered for swelling.  Lot Number:  [id number] Expiration:  11/2022  Total Units Injected:  25  Plan: Patient was instructed to remain upright for 4 hours. Patient was instructed to avoid massaging the face and avoid vigorous exercise for the rest of the day. Tylenol may be used for headache.  Allow 2 weeks before returning to clinic for additional dosing as needed. Patient will call for any problems.   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  Return for 3-10m Botox.   I, Ardis Rowan, RMA, am acting as scribe for Armida Sans, MD .  Documentation: I have reviewed the above documentation for accuracy and completeness, and I agree with the above.  Armida Sans, MD

## 2020-10-21 ENCOUNTER — Ambulatory Visit: Payer: 59 | Admitting: Dermatology

## 2021-01-07 ENCOUNTER — Other Ambulatory Visit: Payer: Self-pay

## 2021-01-07 ENCOUNTER — Ambulatory Visit (INDEPENDENT_AMBULATORY_CARE_PROVIDER_SITE_OTHER): Payer: Self-pay | Admitting: Dermatology

## 2021-01-07 DIAGNOSIS — L988 Other specified disorders of the skin and subcutaneous tissue: Secondary | ICD-10-CM

## 2021-01-07 NOTE — Progress Notes (Signed)
   Follow-Up Visit   Subjective  Stacy [name] is a 30 y.o. female who presents for the following: Facial Elastosis (Face, pt presents for botox today).  The following portions of the chart were reviewed this encounter and updated as appropriate:   Tobacco  Allergies  Meds  Problems  Med Hx  Surg Hx  Fam Hx      Review of Systems:  No other skin or systemic complaints except as noted in HPI or Assessment and Plan.  Objective  Well appearing patient in no apparent distress; mood and affect are within normal limits.  A focused examination was performed including face. Relevant physical exam findings are noted in the Assessment and Plan.  face Rhytides and volume loss.       Assessment & Plan  Elastosis of skin face  Botox 25 units injected today to: - Frown complex 20 units - Forehead 5 units  Intralesional injection - face Location: frown complex, forehead  Informed consent: Discussed risks (infection, pain, bleeding, bruising, swelling, allergic reaction, paralysis of nearby muscles, eyelid droop, double vision, neck weakness, difficulty breathing, headache, undesirable cosmetic result, and need for additional treatment) and benefits of the procedure, as well as the alternatives.  Informed consent was obtained.  Preparation: The area was cleansed with alcohol.  Procedure Details:  Botox was injected into the dermis with a 30-gauge needle. Pressure applied to any bleeding. Ice packs offered for swelling.  Lot Number:  [id number] Expiration:  04/2022  Total Units Injected:  25  Plan: Patient was instructed to remain upright for 4 hours. Patient was instructed to avoid massaging the face and avoid vigorous exercise for the rest of the day. Tylenol may be used for headache.  Allow 2 weeks before returning to clinic for additional dosing as needed. Patient will call for any problems.   Return for 3-65m Botox.  I, Ardis Rowan, RMA, am acting as scribe for  Armida Sans, MD . Documentation: I have reviewed the above documentation for accuracy and completeness, and I agree with the above.  Armida Sans, MD

## 2021-01-07 NOTE — Patient Instructions (Signed)

## 2021-01-13 ENCOUNTER — Encounter: Payer: Self-pay | Admitting: Dermatology

## 2021-03-10 ENCOUNTER — Ambulatory Visit (INDEPENDENT_AMBULATORY_CARE_PROVIDER_SITE_OTHER): Payer: Self-pay | Admitting: Dermatology

## 2021-03-10 ENCOUNTER — Other Ambulatory Visit: Payer: Self-pay

## 2021-03-10 ENCOUNTER — Encounter: Payer: Self-pay | Admitting: Dermatology

## 2021-03-10 DIAGNOSIS — L988 Other specified disorders of the skin and subcutaneous tissue: Secondary | ICD-10-CM

## 2021-03-10 NOTE — Progress Notes (Signed)
   Follow-Up Visit   Subjective  Stacy [name] is a 30 y.o. female who presents for the following: Facial Elastosis (Patient is here today for Botox injections.).  The following portions of the chart were reviewed this encounter and updated as appropriate:   Tobacco  Allergies  Meds  Problems  Med Hx  Surg Hx  Fam Hx     Review of Systems:  No other skin or systemic complaints except as noted in HPI or Assessment and Plan.  Objective  Well appearing patient in no apparent distress; mood and affect are within normal limits.  A focused examination was performed including the face. Relevant physical exam findings are noted in the Assessment and Plan.  Face Rhytides and volume loss.      Assessment & Plan  Elastosis of skin Face  Botox 25 units injected as marked: - Frown complex 20 units - Forehead 5 units  Consider increasing the frown complex to 30 units at follow up appointment.   Botox Injection - Face Location: See attached image  Informed consent: Discussed risks (infection, pain, bleeding, bruising, swelling, allergic reaction, paralysis of nearby muscles, eyelid droop, double vision, neck weakness, difficulty breathing, headache, undesirable cosmetic result, and need for additional treatment) and benefits of the procedure, as well as the alternatives.  Informed consent was obtained.  Preparation: The area was cleansed with alcohol.  Procedure Details:  Botox was injected into the dermis with a 30-gauge needle. Pressure applied to any bleeding. Ice packs offered for swelling.  Lot Number:  [id number] Expiration:  12/2022  Total Units Injected:  25  Plan: Patient was instructed to remain upright for 4 hours. Patient was instructed to avoid massaging the face and avoid vigorous exercise for the rest of the day. Tylenol may be used for headache.  Allow 2 weeks before returning to clinic for additional dosing as needed. Patient will call for any  problems.   Return in about 3 months (around 06/10/2021) for Botox injections.  Maylene Roes, CMA, am acting as scribe for Armida Sans, MD . Documentation: I have reviewed the above documentation for accuracy and completeness, and I agree with the above.  Armida Sans, MD

## 2021-03-10 NOTE — Patient Instructions (Signed)

## 2021-05-12 ENCOUNTER — Other Ambulatory Visit: Payer: Self-pay

## 2021-05-12 ENCOUNTER — Ambulatory Visit (INDEPENDENT_AMBULATORY_CARE_PROVIDER_SITE_OTHER): Payer: Self-pay | Admitting: Dermatology

## 2021-05-12 DIAGNOSIS — L988 Other specified disorders of the skin and subcutaneous tissue: Secondary | ICD-10-CM

## 2021-05-12 NOTE — Progress Notes (Signed)
   Follow-Up Visit   Subjective  Stacy [name] is a 30 y.o. female who presents for the following: Facial Elastosis (Face, pt presents for botox today).  The following portions of the chart were reviewed this encounter and updated as appropriate:   Tobacco  Allergies  Meds  Problems  Med Hx  Surg Hx  Fam Hx     Review of Systems:  No other skin or systemic complaints except as noted in HPI or Assessment and Plan.  Objective  Well appearing patient in no apparent distress; mood and affect are within normal limits.  A focused examination was performed including face. Relevant physical exam findings are noted in the Assessment and Plan.  Head - Anterior (Face) Rhytides and volume loss.       Assessment & Plan  Elastosis of skin Head - Anterior (Face)  Botox 25 units injected today to: - Frown complex 20 units - Forehead 5 units  Filling material injection - Head - Anterior (Face) Location: frown complex, forehead  Informed consent: Discussed risks (infection, pain, bleeding, bruising, swelling, allergic reaction, paralysis of nearby muscles, eyelid droop, double vision, neck weakness, difficulty breathing, headache, undesirable cosmetic result, and need for additional treatment) and benefits of the procedure, as well as the alternatives.  Informed consent was obtained.  Preparation: The area was cleansed with alcohol.  Procedure Details:  Botox was injected into the dermis with a 30-gauge needle. Pressure applied to any bleeding. Ice packs offered for swelling.  Lot Number:  [id number] Expiration:  01/2023  Total Units Injected:  25  Plan: Patient was instructed to remain upright for 4 hours. Patient was instructed to avoid massaging the face and avoid vigorous exercise for the rest of the day. Tylenol may be used for headache.  Allow 2 weeks before returning to clinic for additional dosing as needed. Patient will call for any problems.   Return for 3-3m  Botox.  I, Ardis Rowan, RMA, am acting as scribe for Armida Sans, MD . Documentation: I have reviewed the above documentation for accuracy and completeness, and I agree with the above.  Armida Sans, MD

## 2021-05-12 NOTE — Patient Instructions (Signed)

## 2021-05-13 ENCOUNTER — Encounter: Payer: Self-pay | Admitting: Dermatology

## 2021-05-26 ENCOUNTER — Ambulatory Visit: Payer: 59 | Admitting: Dermatology

## 2021-08-18 ENCOUNTER — Ambulatory Visit (INDEPENDENT_AMBULATORY_CARE_PROVIDER_SITE_OTHER): Payer: Self-pay | Admitting: Dermatology

## 2021-08-18 ENCOUNTER — Other Ambulatory Visit: Payer: Self-pay

## 2021-08-18 DIAGNOSIS — L988 Other specified disorders of the skin and subcutaneous tissue: Secondary | ICD-10-CM

## 2021-08-18 NOTE — Patient Instructions (Signed)

## 2021-08-18 NOTE — Progress Notes (Signed)
° °  Follow-Up Visit   Subjective  Stacy [name] is a 31 y.o. female who presents for the following: Facial Elastosis (Face, pt presents for Botox).  The following portions of the chart were reviewed this encounter and updated as appropriate:   Tobacco   Allergies   Meds   Problems   Med Hx   Surg Hx   Fam Hx      Review of Systems:  No other skin or systemic complaints except as noted in HPI or Assessment and Plan.  Objective  Well appearing patient in no apparent distress; mood and affect are within normal limits.  A focused examination was performed including face. Relevant physical exam findings are noted in the Assessment and Plan.  Head - Anterior (Face) Rhytides and volume loss.       Assessment & Plan  Elastosis of skin Head - Anterior (Face)  Botox 25 units injected today to: - Frown complex 20 units - Forehead 5 units  Botox Injection - Head - Anterior (Face) Location: frown complex, forehead  Informed consent: Discussed risks (infection, pain, bleeding, bruising, swelling, allergic reaction, paralysis of nearby muscles, eyelid droop, double vision, neck weakness, difficulty breathing, headache, undesirable cosmetic result, and need for additional treatment) and benefits of the procedure, as well as the alternatives.  Informed consent was obtained.  Preparation: The area was cleansed with alcohol.  Procedure Details:  Botox was injected into the dermis with a 30-gauge needle. Pressure applied to any bleeding. Ice packs offered for swelling.  Lot Number:  [id number] Expiration:  08/2023  Total Units Injected:  25  Plan: Patient was instructed to remain upright for 4 hours. Patient was instructed to avoid massaging the face and avoid vigorous exercise for the rest of the day. Tylenol may be used for headache.  Allow 2 weeks before returning to clinic for additional dosing as needed. Patient will call for any problems.    Return for 3-61m Botox.  I, Othelia Pulling, RMA, am acting as scribe for Sarina Ser, MD . Documentation: I have reviewed the above documentation for accuracy and completeness, and I agree with the above.  Sarina Ser, MD

## 2021-08-20 ENCOUNTER — Encounter: Payer: Self-pay | Admitting: Dermatology

## 2021-11-17 ENCOUNTER — Ambulatory Visit: Payer: 59 | Admitting: Dermatology

## 2024-02-05 ENCOUNTER — Encounter: Payer: Self-pay | Admitting: Emergency Medicine

## 2024-02-05 ENCOUNTER — Ambulatory Visit
Admission: EM | Admit: 2024-02-05 | Discharge: 2024-02-05 | Disposition: A | Discharge status: Home / Self Care | Payer: Self-pay | Attending: Emergency Medicine | Admitting: Emergency Medicine

## 2024-02-05 DIAGNOSIS — S51831A Puncture wound without foreign body of right forearm, initial encounter: Secondary | ICD-10-CM

## 2024-02-05 DIAGNOSIS — S40861A Insect bite (nonvenomous) of right upper arm, initial encounter: Secondary | ICD-10-CM

## 2024-02-05 DIAGNOSIS — W57XXXA Bitten or stung by nonvenomous insect and other nonvenomous arthropods, initial encounter: Secondary | ICD-10-CM

## 2024-02-05 MED ORDER — TRIAMCINOLONE ACETONIDE 0.1 % EX CREA: 1.0000 | TOPICAL_CREAM | Freq: Two times a day (BID) | CUTANEOUS | 0 refills | Status: AC

## 2024-02-05 MED ORDER — PREDNISONE 10 MG (21) PO TBPK: ORAL_TABLET | Freq: Every day | ORAL | 0 refills | Status: AC

## 2024-02-05 NOTE — ED Triage Notes (Signed)
 Patient reports insect bite on last Thursday. More concerns with bite on right wrist. Patient complains of itching. Patient reports that she has used neosporin on bites. Denies pain.

## 2024-02-05 NOTE — ED Provider Notes (Signed)
 Stacy [name]    CSN: [id number] Arrival date & time: 02/05/24  1354      History   Chief Complaint Chief Complaint  Patient presents with   Insect Bite    HPI Stacy [name] is a 33 y.o. female.   Patient presents for evaluation for a itchy swollen insect bite to the right wrist that occurred 2 days ago.  Has several other bites scattered over the right forearm and 1 lesion to the right thigh.  Denies drainage, redness or fever.  Has applied Neosporin and placed a Band-Aid over.  Past Medical History:  Diagnosis Date   Acid reflux    ADD (attention deficit disorder)    Allergy    Depression    Hypertension     Patient Active Problem List   Diagnosis Date Noted   Decreased libido 09/23/2015   Menorrhagia with regular cycle 08/26/2015   Breakthrough bleeding with IUD 08/26/2015   Hypertension 07/24/2015   Anxiety 07/24/2015    Past Surgical History:  Procedure Laterality Date   NO PAST SURGERIES      OB History     Gravida  0   Para  0   Term  0   Preterm  0   AB  0   Living  0      SAB  0   IAB  0   Ectopic  0   Multiple  0   Live Births               Home Medications    Prior to Admission medications   Medication Sig Start Date End Date Taking? Authorizing Provider  predniSONE  (STERAPRED UNI-PAK 21 TAB) 10 MG (21) TBPK tablet Take by mouth daily. Take 6 tabs by mouth daily  for 1 days, then 5 tabs for 1 days, then 4 tabs for 1 days, then 3 tabs for 1 days, 2 tabs for 1 days, then 1 tab by mouth daily for 1 days 02/05/24  Yes Brent Taillon R, NP  triamcinolone  cream (KENALOG ) 0.1 % Apply 1 Application topically 2 (two) times daily. 02/05/24  Yes Elira Colasanti, Shelba R, NP  atomoxetine (STRATTERA) 100 MG capsule Take 100 mg by mouth daily.    [provider]  carvedilol (COREG) 12.5 MG tablet Take 12.5 mg by mouth 2 (two) times daily with a meal.    [provider]  cetirizine (ZYRTEC) 10 MG tablet Take 10  mg by mouth daily.    [provider]  clindamycin -tretinoin  (ZIANA ) gel Apply topically at bedtime. 09/22/20   Hester Alm BROCKS, MD  FLUoxetine (PROZAC) 20 MG capsule  12/09/16   [provider]  Norethindrone-Ethinyl Estradiol-Fe Biphas (LO LOESTRIN FE) 1 MG-10 MCG / 10 MCG tablet Take 1 tablet by mouth daily. 08/10/17   Defrancesco, Gladis DELENA, MD  pantoprazole (PROTONIX) 40 MG tablet  12/02/16   [provider]    Family History Family History  Problem Relation Age of Onset   Heart disease Mother    Heart disease Father    Cancer Neg Hx    Diabetes Neg Hx     Social History Social History   Tobacco Use   Smoking status: Never   Smokeless tobacco: Never  Vaping Use   Vaping status: Never Used  Substance Use Topics   Alcohol use: Yes    Comment: occas   Drug use: No     Allergies   Red dye #40 (allura red)   Review  of Systems Review of Systems   Physical Exam Triage Vital Signs ED Triage Vitals  Encounter Vitals Group     BP 02/05/24 1418 113/77     Girls Systolic BP Percentile --      Girls Diastolic BP Percentile --      Boys Systolic BP Percentile --      Boys Diastolic BP Percentile --      Pulse Rate 02/05/24 1418 85     Resp 02/05/24 1418 18     Temp 02/05/24 1418 98.5 F (36.9 C)     Temp src --      SpO2 02/05/24 1418 98 %     Weight --      Height --      Head Circumference --      Peak Flow --      Pain Score 02/05/24 1414 0     Pain Loc --      Pain Education --      Exclude from Growth Chart --    No data found.  Updated Vital Signs BP 113/77 (BP Location: Left Arm)   Pulse 85   Temp 98.5 F (36.9 C)   Resp 18   LMP 01/06/2024 (Approximate)   SpO2 98%   Visual Acuity Right Eye Distance:   Left Eye Distance:   Bilateral Distance:    Right Eye Near:   Left Eye Near:    Bilateral Near:     Physical Exam Constitutional:      Appearance: Normal appearance.  Eyes:     Extraocular Movements: Extraocular  movements intact.  Pulmonary:     Effort: Pulmonary effort is normal.  Skin:    Comments: 0.5 cm puncture wound present to the anterior of the right forearm with surrounding swelling, mild blistering, no drainage or erythema  Neurological:     Mental Status: She is alert and oriented to person, place, and time.      UC Treatments / Results  Labs (all labs ordered are listed, but only abnormal results are displayed) Labs Reviewed - No data to display  EKG   Radiology No results found.  Procedures Procedures (including critical care time)  Medications Ordered in UC Medications - No data to display  Initial Impression / Assessment and Plan / UC Course  I have reviewed the triage vital signs and the nursing notes.  Pertinent labs & imaging results that were available during my care of the patient were reviewed by me and considered in my medical decision making (see chart for details).  Puncture wound of right forearm, insect bite of right arm  Appears to be localized reaction, no signs of necrosis or infection, discussed this with patient, prescribed triamcinolone  cream, if ineffective will initiate oral steroids wanted to hold off on initial treatment with oral medicine as it worsens her anxiety, recommended supportive care for management of pruritus and advise follow-up as needed Final Clinical Impressions(s) / UC Diagnoses   Final diagnoses:  Insect bite of right arm, initial encounter  Puncture wound of right forearm, initial encounter     Discharge Instructions      Today you were evaluated for your insect bite which I do believe you are having a localized reaction to as you are experiencing itchiness and swelling, low suspicion for infection as this typically presents as redness swelling pain and possible drainage, low suspicion for tissue death  which is concerning with spider bites is that there is no discoloration such as a  dark to gray appearance, coldness of the  skin or pain  Begin triamcinolone  cream twice daily until clear, this helps to reduce inflammation and helps with itchiness  If no improvement seen within 3 days of use of cream may initiate oral steroids, take as directed  May apply ice over the affected area 10 to 15 minutes as needed for comfort  For itching May take allergy medicine such as Claritin Zyrtec or Benadryl, may apply a topical Benadryl cream or calamine lotion additionally  Heat can cause irritation to the skin therefore if area becomes more inflamed or itchy,  cool the skin down  May follow-up as needed if symptoms persist or worsen   ED Prescriptions     Medication Sig Dispense Auth. Provider   triamcinolone  cream (KENALOG ) 0.1 % Apply 1 Application topically 2 (two) times daily. 30 g Roya Gieselman R, NP   predniSONE  (STERAPRED UNI-PAK 21 TAB) 10 MG (21) TBPK tablet Take by mouth daily. Take 6 tabs by mouth daily  for 1 days, then 5 tabs for 1 days, then 4 tabs for 1 days, then 3 tabs for 1 days, 2 tabs for 1 days, then 1 tab by mouth daily for 1 days 21 tablet Jakolby Sedivy, Shelba SAUNDERS, NP      PDMP not reviewed this encounter.   Teresa Shelba SAUNDERS, NP 02/05/24 1452

## 2024-02-05 NOTE — Discharge Instructions (Signed)
 Today you were evaluated for your insect bite which I do believe you are having a localized reaction to as you are experiencing itchiness and swelling, low suspicion for infection as this typically presents as redness swelling pain and possible drainage, low suspicion for tissue death  which is concerning with spider bites is that there is no discoloration such as a dark to gray appearance, coldness of the skin or pain  Begin triamcinolone  cream twice daily until clear, this helps to reduce inflammation and helps with itchiness  If no improvement seen within 3 days of use of cream may initiate oral steroids, take as directed  May apply ice over the affected area 10 to 15 minutes as needed for comfort  For itching May take allergy medicine such as Claritin Zyrtec or Benadryl, may apply a topical Benadryl cream or calamine lotion additionally  Heat can cause irritation to the skin therefore if area becomes more inflamed or itchy,  cool the skin down  May follow-up as needed if symptoms persist or worsen
